# Patient Record
Sex: Male | Born: 1935 | Race: White | Hispanic: No | Marital: Married | Smoking: Current some day smoker
Health system: Southern US, Community
[De-identification: ages and names within clinical notes are randomized; demographics above are authoritative.]

## PROBLEM LIST (undated history)

## (undated) DIAGNOSIS — E785 Hyperlipidemia, unspecified: Secondary | ICD-10-CM

## (undated) DIAGNOSIS — E119 Type 2 diabetes mellitus without complications: Secondary | ICD-10-CM

## (undated) DIAGNOSIS — N289 Disorder of kidney and ureter, unspecified: Secondary | ICD-10-CM

## (undated) DIAGNOSIS — I10 Essential (primary) hypertension: Secondary | ICD-10-CM

## (undated) DIAGNOSIS — C259 Malignant neoplasm of pancreas, unspecified: Secondary | ICD-10-CM

## (undated) HISTORY — PX: OTHER SURGICAL HISTORY: SHX169

---

## 2014-03-05 DIAGNOSIS — E785 Hyperlipidemia, unspecified: Secondary | ICD-10-CM | POA: Insufficient documentation

## 2014-03-05 DIAGNOSIS — I10 Essential (primary) hypertension: Secondary | ICD-10-CM | POA: Insufficient documentation

## 2014-03-05 DIAGNOSIS — N184 Chronic kidney disease, stage 4 (severe): Secondary | ICD-10-CM | POA: Insufficient documentation

## 2014-03-24 ENCOUNTER — Ambulatory Visit: Payer: Self-pay | Admitting: Internal Medicine

## 2014-04-10 ENCOUNTER — Ambulatory Visit: Payer: Self-pay | Admitting: Internal Medicine

## 2014-05-11 ENCOUNTER — Ambulatory Visit: Payer: Self-pay | Admitting: Internal Medicine

## 2014-06-10 ENCOUNTER — Ambulatory Visit: Payer: Self-pay | Admitting: Internal Medicine

## 2015-04-10 DIAGNOSIS — E1121 Type 2 diabetes mellitus with diabetic nephropathy: Secondary | ICD-10-CM | POA: Insufficient documentation

## 2015-09-02 DIAGNOSIS — L578 Other skin changes due to chronic exposure to nonionizing radiation: Secondary | ICD-10-CM | POA: Diagnosis not present

## 2015-09-02 DIAGNOSIS — L859 Epidermal thickening, unspecified: Secondary | ICD-10-CM | POA: Diagnosis not present

## 2015-09-02 DIAGNOSIS — L570 Actinic keratosis: Secondary | ICD-10-CM | POA: Diagnosis not present

## 2015-10-07 DIAGNOSIS — E1121 Type 2 diabetes mellitus with diabetic nephropathy: Secondary | ICD-10-CM | POA: Diagnosis not present

## 2015-10-14 DIAGNOSIS — E1121 Type 2 diabetes mellitus with diabetic nephropathy: Secondary | ICD-10-CM | POA: Diagnosis not present

## 2015-10-14 DIAGNOSIS — N182 Chronic kidney disease, stage 2 (mild): Secondary | ICD-10-CM | POA: Diagnosis not present

## 2015-10-14 DIAGNOSIS — I10 Essential (primary) hypertension: Secondary | ICD-10-CM | POA: Diagnosis not present

## 2015-11-04 DIAGNOSIS — L578 Other skin changes due to chronic exposure to nonionizing radiation: Secondary | ICD-10-CM | POA: Diagnosis not present

## 2015-11-04 DIAGNOSIS — L570 Actinic keratosis: Secondary | ICD-10-CM | POA: Diagnosis not present

## 2015-12-08 DIAGNOSIS — L570 Actinic keratosis: Secondary | ICD-10-CM | POA: Diagnosis not present

## 2016-01-27 DIAGNOSIS — E118 Type 2 diabetes mellitus with unspecified complications: Secondary | ICD-10-CM | POA: Diagnosis not present

## 2016-02-02 DIAGNOSIS — G8929 Other chronic pain: Secondary | ICD-10-CM | POA: Diagnosis not present

## 2016-02-02 DIAGNOSIS — E1121 Type 2 diabetes mellitus with diabetic nephropathy: Secondary | ICD-10-CM | POA: Diagnosis not present

## 2016-02-02 DIAGNOSIS — M25561 Pain in right knee: Secondary | ICD-10-CM | POA: Diagnosis not present

## 2016-02-02 DIAGNOSIS — I10 Essential (primary) hypertension: Secondary | ICD-10-CM | POA: Diagnosis not present

## 2016-02-03 DIAGNOSIS — L578 Other skin changes due to chronic exposure to nonionizing radiation: Secondary | ICD-10-CM | POA: Diagnosis not present

## 2016-02-03 DIAGNOSIS — L570 Actinic keratosis: Secondary | ICD-10-CM | POA: Diagnosis not present

## 2016-02-03 DIAGNOSIS — Z85828 Personal history of other malignant neoplasm of skin: Secondary | ICD-10-CM | POA: Diagnosis not present

## 2016-02-03 DIAGNOSIS — L820 Inflamed seborrheic keratosis: Secondary | ICD-10-CM | POA: Diagnosis not present

## 2016-02-05 DIAGNOSIS — M6240 Contracture of muscle, unspecified site: Secondary | ICD-10-CM | POA: Diagnosis not present

## 2016-02-05 DIAGNOSIS — M791 Myalgia: Secondary | ICD-10-CM | POA: Diagnosis not present

## 2016-02-05 DIAGNOSIS — M9905 Segmental and somatic dysfunction of pelvic region: Secondary | ICD-10-CM | POA: Diagnosis not present

## 2016-02-05 DIAGNOSIS — M5430 Sciatica, unspecified side: Secondary | ICD-10-CM | POA: Diagnosis not present

## 2016-02-05 DIAGNOSIS — M9903 Segmental and somatic dysfunction of lumbar region: Secondary | ICD-10-CM | POA: Diagnosis not present

## 2016-02-05 DIAGNOSIS — M9902 Segmental and somatic dysfunction of thoracic region: Secondary | ICD-10-CM | POA: Diagnosis not present

## 2016-02-08 DIAGNOSIS — M9903 Segmental and somatic dysfunction of lumbar region: Secondary | ICD-10-CM | POA: Diagnosis not present

## 2016-02-08 DIAGNOSIS — M791 Myalgia: Secondary | ICD-10-CM | POA: Diagnosis not present

## 2016-02-08 DIAGNOSIS — M9905 Segmental and somatic dysfunction of pelvic region: Secondary | ICD-10-CM | POA: Diagnosis not present

## 2016-02-08 DIAGNOSIS — M6240 Contracture of muscle, unspecified site: Secondary | ICD-10-CM | POA: Diagnosis not present

## 2016-02-08 DIAGNOSIS — M5430 Sciatica, unspecified side: Secondary | ICD-10-CM | POA: Diagnosis not present

## 2016-02-08 DIAGNOSIS — M9902 Segmental and somatic dysfunction of thoracic region: Secondary | ICD-10-CM | POA: Diagnosis not present

## 2016-02-10 DIAGNOSIS — M5430 Sciatica, unspecified side: Secondary | ICD-10-CM | POA: Diagnosis not present

## 2016-02-10 DIAGNOSIS — M9903 Segmental and somatic dysfunction of lumbar region: Secondary | ICD-10-CM | POA: Diagnosis not present

## 2016-02-10 DIAGNOSIS — M9902 Segmental and somatic dysfunction of thoracic region: Secondary | ICD-10-CM | POA: Diagnosis not present

## 2016-02-10 DIAGNOSIS — M791 Myalgia: Secondary | ICD-10-CM | POA: Diagnosis not present

## 2016-02-10 DIAGNOSIS — M9905 Segmental and somatic dysfunction of pelvic region: Secondary | ICD-10-CM | POA: Diagnosis not present

## 2016-02-10 DIAGNOSIS — M6240 Contracture of muscle, unspecified site: Secondary | ICD-10-CM | POA: Diagnosis not present

## 2016-02-12 DIAGNOSIS — M9902 Segmental and somatic dysfunction of thoracic region: Secondary | ICD-10-CM | POA: Diagnosis not present

## 2016-02-12 DIAGNOSIS — M9903 Segmental and somatic dysfunction of lumbar region: Secondary | ICD-10-CM | POA: Diagnosis not present

## 2016-02-12 DIAGNOSIS — M9905 Segmental and somatic dysfunction of pelvic region: Secondary | ICD-10-CM | POA: Diagnosis not present

## 2016-02-12 DIAGNOSIS — M6240 Contracture of muscle, unspecified site: Secondary | ICD-10-CM | POA: Diagnosis not present

## 2016-02-12 DIAGNOSIS — M5430 Sciatica, unspecified side: Secondary | ICD-10-CM | POA: Diagnosis not present

## 2016-02-12 DIAGNOSIS — M791 Myalgia: Secondary | ICD-10-CM | POA: Diagnosis not present

## 2016-02-19 DIAGNOSIS — M5430 Sciatica, unspecified side: Secondary | ICD-10-CM | POA: Diagnosis not present

## 2016-02-19 DIAGNOSIS — M791 Myalgia: Secondary | ICD-10-CM | POA: Diagnosis not present

## 2016-02-19 DIAGNOSIS — M9905 Segmental and somatic dysfunction of pelvic region: Secondary | ICD-10-CM | POA: Diagnosis not present

## 2016-02-19 DIAGNOSIS — M6240 Contracture of muscle, unspecified site: Secondary | ICD-10-CM | POA: Diagnosis not present

## 2016-02-19 DIAGNOSIS — M9902 Segmental and somatic dysfunction of thoracic region: Secondary | ICD-10-CM | POA: Diagnosis not present

## 2016-02-19 DIAGNOSIS — M9903 Segmental and somatic dysfunction of lumbar region: Secondary | ICD-10-CM | POA: Diagnosis not present

## 2016-02-26 DIAGNOSIS — M5430 Sciatica, unspecified side: Secondary | ICD-10-CM | POA: Diagnosis not present

## 2016-02-26 DIAGNOSIS — M9905 Segmental and somatic dysfunction of pelvic region: Secondary | ICD-10-CM | POA: Diagnosis not present

## 2016-02-26 DIAGNOSIS — M6240 Contracture of muscle, unspecified site: Secondary | ICD-10-CM | POA: Diagnosis not present

## 2016-02-26 DIAGNOSIS — M9903 Segmental and somatic dysfunction of lumbar region: Secondary | ICD-10-CM | POA: Diagnosis not present

## 2016-02-26 DIAGNOSIS — M9902 Segmental and somatic dysfunction of thoracic region: Secondary | ICD-10-CM | POA: Diagnosis not present

## 2016-02-26 DIAGNOSIS — M791 Myalgia: Secondary | ICD-10-CM | POA: Diagnosis not present

## 2016-03-09 DIAGNOSIS — M9903 Segmental and somatic dysfunction of lumbar region: Secondary | ICD-10-CM | POA: Diagnosis not present

## 2016-03-09 DIAGNOSIS — M9905 Segmental and somatic dysfunction of pelvic region: Secondary | ICD-10-CM | POA: Diagnosis not present

## 2016-03-09 DIAGNOSIS — M6240 Contracture of muscle, unspecified site: Secondary | ICD-10-CM | POA: Diagnosis not present

## 2016-03-09 DIAGNOSIS — M5430 Sciatica, unspecified side: Secondary | ICD-10-CM | POA: Diagnosis not present

## 2016-03-09 DIAGNOSIS — M9902 Segmental and somatic dysfunction of thoracic region: Secondary | ICD-10-CM | POA: Diagnosis not present

## 2016-03-09 DIAGNOSIS — M791 Myalgia: Secondary | ICD-10-CM | POA: Diagnosis not present

## 2016-03-17 DIAGNOSIS — L570 Actinic keratosis: Secondary | ICD-10-CM | POA: Diagnosis not present

## 2016-04-14 DIAGNOSIS — E1121 Type 2 diabetes mellitus with diabetic nephropathy: Secondary | ICD-10-CM | POA: Diagnosis not present

## 2016-04-21 DIAGNOSIS — E1121 Type 2 diabetes mellitus with diabetic nephropathy: Secondary | ICD-10-CM | POA: Diagnosis not present

## 2016-04-21 DIAGNOSIS — Z0001 Encounter for general adult medical examination with abnormal findings: Secondary | ICD-10-CM | POA: Diagnosis not present

## 2016-04-21 DIAGNOSIS — D649 Anemia, unspecified: Secondary | ICD-10-CM | POA: Diagnosis not present

## 2016-04-21 DIAGNOSIS — I10 Essential (primary) hypertension: Secondary | ICD-10-CM | POA: Diagnosis not present

## 2016-04-21 DIAGNOSIS — Z0000 Encounter for general adult medical examination without abnormal findings: Secondary | ICD-10-CM | POA: Diagnosis not present

## 2016-04-21 DIAGNOSIS — E7800 Pure hypercholesterolemia, unspecified: Secondary | ICD-10-CM | POA: Diagnosis not present

## 2016-04-21 DIAGNOSIS — Z23 Encounter for immunization: Secondary | ICD-10-CM | POA: Diagnosis not present

## 2016-04-25 DIAGNOSIS — D649 Anemia, unspecified: Secondary | ICD-10-CM | POA: Diagnosis not present

## 2016-05-09 ENCOUNTER — Ambulatory Visit: Payer: PPO | Admitting: Podiatry

## 2016-05-16 ENCOUNTER — Encounter: Payer: Self-pay | Admitting: Podiatry

## 2016-05-16 ENCOUNTER — Ambulatory Visit (INDEPENDENT_AMBULATORY_CARE_PROVIDER_SITE_OTHER): Payer: PPO

## 2016-05-16 ENCOUNTER — Ambulatory Visit (INDEPENDENT_AMBULATORY_CARE_PROVIDER_SITE_OTHER): Payer: PPO | Admitting: Podiatry

## 2016-05-16 ENCOUNTER — Other Ambulatory Visit: Payer: Self-pay | Admitting: *Deleted

## 2016-05-16 ENCOUNTER — Encounter: Payer: Self-pay | Admitting: *Deleted

## 2016-05-16 VITALS — BP 128/72 | HR 70 | Resp 16

## 2016-05-16 DIAGNOSIS — M79671 Pain in right foot: Secondary | ICD-10-CM

## 2016-05-16 DIAGNOSIS — M79672 Pain in left foot: Secondary | ICD-10-CM

## 2016-05-16 DIAGNOSIS — M79676 Pain in unspecified toe(s): Secondary | ICD-10-CM | POA: Diagnosis not present

## 2016-05-16 DIAGNOSIS — B351 Tinea unguium: Secondary | ICD-10-CM | POA: Diagnosis not present

## 2016-05-16 DIAGNOSIS — M722 Plantar fascial fibromatosis: Secondary | ICD-10-CM

## 2016-05-16 NOTE — Progress Notes (Signed)
   Subjective:    Patient ID: Ethan Henry, male    DOB: [birth year], 82 y.o.   MRN: [id number]  HPI: He presents today with a chief complaint of painful back and knee as well as his heel and was recommended to follow up with [place] or orthotics by physical therapy. He also complains of painfully elongated toenails bilateral.    Review of Systems  Musculoskeletal: Positive for arthralgias and back pain.  All other systems reviewed and are negative.      Objective:   Physical Exam: Vital signs are stable he is alert and oriented 3. Pulses are strongly palpable. Neurologic sensorium is intact. Deep tendon reflexes are intact. Muscle strength is normal bilateral. Orthopedic evaluation demonstrates tenderness around the ankle and knee in the right heel. Radiographs do not demonstrate any Major osseous abnormalities. No fractures so her last x-ray changes midfoot. Soft tissue swelling around the plantar medial calcaneal tubercle. Cutaneous evaluation shows supple well-hydrated cutis tenderness along thick yellow dystrophic with mycotic.        Assessment & Plan:  Painless onychomycosis plan fasciitis ankle pain and knee pain.  Plan: He was scanned for set of orthotics today and I debrided his toenails 1 through 5 bilaterally.

## 2016-05-27 DIAGNOSIS — E119 Type 2 diabetes mellitus without complications: Secondary | ICD-10-CM | POA: Diagnosis not present

## 2016-06-08 DIAGNOSIS — L570 Actinic keratosis: Secondary | ICD-10-CM | POA: Diagnosis not present

## 2016-06-08 DIAGNOSIS — L578 Other skin changes due to chronic exposure to nonionizing radiation: Secondary | ICD-10-CM | POA: Diagnosis not present

## 2016-06-13 ENCOUNTER — Ambulatory Visit: Payer: PPO | Admitting: Podiatry

## 2016-06-13 ENCOUNTER — Encounter: Payer: Self-pay | Admitting: *Deleted

## 2016-07-13 ENCOUNTER — Ambulatory Visit: Payer: PPO | Admitting: Podiatry

## 2016-07-15 DIAGNOSIS — E1121 Type 2 diabetes mellitus with diabetic nephropathy: Secondary | ICD-10-CM | POA: Diagnosis not present

## 2016-07-15 DIAGNOSIS — D649 Anemia, unspecified: Secondary | ICD-10-CM | POA: Diagnosis not present

## 2016-07-22 DIAGNOSIS — I10 Essential (primary) hypertension: Secondary | ICD-10-CM | POA: Diagnosis not present

## 2016-07-22 DIAGNOSIS — E1121 Type 2 diabetes mellitus with diabetic nephropathy: Secondary | ICD-10-CM | POA: Diagnosis not present

## 2016-07-22 DIAGNOSIS — D649 Anemia, unspecified: Secondary | ICD-10-CM | POA: Diagnosis not present

## 2016-08-08 DIAGNOSIS — D649 Anemia, unspecified: Secondary | ICD-10-CM | POA: Diagnosis not present

## 2016-08-10 DIAGNOSIS — R195 Other fecal abnormalities: Secondary | ICD-10-CM | POA: Diagnosis not present

## 2016-08-10 DIAGNOSIS — R197 Diarrhea, unspecified: Secondary | ICD-10-CM | POA: Diagnosis not present

## 2016-08-10 DIAGNOSIS — R634 Abnormal weight loss: Secondary | ICD-10-CM | POA: Diagnosis not present

## 2016-08-10 DIAGNOSIS — R748 Abnormal levels of other serum enzymes: Secondary | ICD-10-CM | POA: Diagnosis not present

## 2016-08-10 DIAGNOSIS — D649 Anemia, unspecified: Secondary | ICD-10-CM | POA: Diagnosis not present

## 2016-08-11 ENCOUNTER — Ambulatory Visit
Admission: RE | Admit: 2016-08-11 | Discharge: 2016-08-11 | Disposition: A | Discharge status: Home / Self Care | Payer: PPO | Source: Ambulatory Visit | Attending: Student | Admitting: Student

## 2016-08-11 ENCOUNTER — Other Ambulatory Visit: Payer: Self-pay | Admitting: Student

## 2016-08-11 DIAGNOSIS — R17 Unspecified jaundice: Secondary | ICD-10-CM | POA: Diagnosis not present

## 2016-08-11 DIAGNOSIS — R748 Abnormal levels of other serum enzymes: Secondary | ICD-10-CM

## 2016-08-11 DIAGNOSIS — E279 Disorder of adrenal gland, unspecified: Secondary | ICD-10-CM | POA: Insufficient documentation

## 2016-08-11 DIAGNOSIS — C787 Secondary malignant neoplasm of liver and intrahepatic bile duct: Secondary | ICD-10-CM | POA: Insufficient documentation

## 2016-08-11 DIAGNOSIS — K869 Disease of pancreas, unspecified: Secondary | ICD-10-CM | POA: Insufficient documentation

## 2016-08-11 DIAGNOSIS — R634 Abnormal weight loss: Secondary | ICD-10-CM

## 2016-08-11 DIAGNOSIS — I81 Portal vein thrombosis: Secondary | ICD-10-CM | POA: Insufficient documentation

## 2016-08-11 DIAGNOSIS — R197 Diarrhea, unspecified: Secondary | ICD-10-CM | POA: Diagnosis not present

## 2016-08-11 HISTORY — DX: Essential (primary) hypertension: I10

## 2016-08-11 HISTORY — DX: Disorder of kidney and ureter, unspecified: N28.9

## 2016-08-11 MED ORDER — IOPAMIDOL (ISOVUE-300) INJECTION 61%
75.0000 mL | Freq: Once | INTRAVENOUS | Status: AC | PRN
  Administered 2016-08-11: 75 mL via INTRAVENOUS

## 2016-08-14 DIAGNOSIS — C787 Secondary malignant neoplasm of liver and intrahepatic bile duct: Secondary | ICD-10-CM | POA: Insufficient documentation

## 2016-08-14 DIAGNOSIS — I81 Portal vein thrombosis: Secondary | ICD-10-CM | POA: Insufficient documentation

## 2016-08-14 NOTE — Progress Notes (Signed)
[facility] day:  08/15/2016  Chief Complaint: Ethan Henry is a 83 y.o. male with a pancreatic mass and liver metastasis who is referred by Dr. Vira Agar in consultation for assessment and management.  HPI:   The patient was seen by gastroenterology on 08/10/2016 for diarrhea and anemia. Diarrhea appeared to correlate with an increased dose of Actos.  Actos was discontinued on 07/22/2016 with slight improvement in stool.  He also noted a 9 pound weight loss since 04/21/2016.  Hemogloblin had decreased from 13.2 on 04/14/2016 to 11.8 on 07/15/2016.  Ferritin and iron studies were normal on 04/21/2016 and 07/22/2016.  Creatinine had increased from 1.4 to 1.7.  One of 2 guaiac cards were positive on 08/08/2016.  Labs on 08/10/2016 revealed a hematocrit of 35.5, hemoglobin 11.7, platelets 215,000, white count 13,800 with an [place] of 10,690. Comprehensive metabolic panel included an AST 55, ALT 52, bilirubin 1.5, and alkaline phosphatase 539.  Fractionated alkaline phosphatase revealed a value of 602 (liver 61%, bone 39%).  GGT was 362.  Sodium was 139, potassium 5.8, creatinine 1.7, glucose 358, albumen 3.9.  Because of elevated liver function tests, imaging was ordered.  Abdomen and pelvic CT scan on 08/11/2016 revealed a 6.5 cm mass in the pancreatic body and tail consistent with primary pancreatic carcinoma.  The mass involved the splenic artery and vein as well as the superior mesenteric vein.  There was diffuse liver metastasis.  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 There was tiny subcentimeter peripancreatic lymph nodes. There was an indeterminate 2 cm left adrenal mass.  Symptomatically, he a 26 pound weight loss since 04/2016.  He feels generally weak.  He denies any nausea, vomiting or diarrhea.  He typically has 1 bowel movement a day, but periodically has 3-4 a day.  He notes intermittent right shoulder pain.   Past Medical History:   Diagnosis Date  . Hypertension   . Renal insufficiency    stage 2 renal dz.     Past Surgical History:  Procedure Laterality Date  . Partial shoulder arthroscopy      History reviewed. No pertinent family history.  Social History:  reports that he has been smoking.  He has been smoking about 1.50 packs per day. He has never used smokeless tobacco. His alcohol and drug histories are not on file.  He drinks beer on the weekend.  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  He lives in [place].  The patient is accompanied by his wife, Ethan Henry, and his daughter, Ethan Henry, today.  Allergies:  Allergies  Allergen Reactions  . Glipizide Rash    Current Medications: Current Outpatient Prescriptions  Medication Sig Dispense Refill  . amLODipine (NORVASC) 5 MG tablet     . aspirin EC 81 MG tablet Take by mouth.    . Blood Glucose Calibration (OT ULTRA/FASTTK CNTRL SOLN) SOLN     . Cholecalciferol (VITAMIN D-1000 MAX ST) 1000 units tablet Take by mouth.    . Cyanocobalamin (RA VITAMIN B-12 TR) 1000 MCG TBCR Take by mouth.    Marland Kitchen glucose blood (ONE TOUCH ULTRA TEST) test strip     . Lancets (ONETOUCH ULTRASOFT) lancets     . losartan-hydrochlorothiazide (HYZAAR) 100-25 MG tablet TAKE ONE (1) TABLET BY MOUTH EVERY DAY    . meloxicam (MOBIC) 7.5 MG tablet TAKE ONE (1) TABLET BY MOUTH EVERY DAY    . Omega-3 Fatty Acids (FISH OIL PO) Take by  mouth.    . pravastatin (PRAVACHOL) 40 MG tablet TAKE ONE TABLET EACH EVENING AS DIRECTED    . pioglitazone (ACTOS) 30 MG tablet     . vitamin C (ASCORBIC ACID) 500 MG tablet Take by mouth.     No current facility-administered medications for this visit.     Review of Systems:  GENERAL:  Feels weak.  No fevers or sweats .  Weight loss of 26 pounds (151 pounds to 125 pounds) since 04/2016. PERFORMANCE STATUS (ECOG):  1 HEENT:  No visual changes, runny nose, sore throat, mouth sores or tenderness. Lungs: No shortness of breath or cough.  No  hemoptysis. Cardiac:  No chest pain, palpitations, orthopnea, or PND. GI:  No nausea, vomiting, constipation, melena or hematochezia.  1 bowel movement a day, but periodically has 3-4/day.  No prior colonoscopy. GU:  No urgency, frequency, dysuria, or hematuria. Musculoskeletal:  No back pain.  Intermittent right shoulder pain.  No muscle tenderness. Extremities:  No pain or swelling. Skin:  No rashes or skin changes. Neuro:  No headache, numbness or weakness, balance or coordination issues. Endocrine:  No diabetes, thyroid issues, hot flashes or night sweats. Psych:  No mood changes, depression or anxiety. Pain:  No focal pain. Review of systems:  All other systems reviewed and found to be negative.  Physical Exam: Blood pressure 121/81, pulse (!) 130, temperature (!) 94.9 F (34.9 C), temperature source Tympanic, resp. rate 18, height 5\' 7"  (1.702 m), weight 125 lb 3.5 oz (56.8 kg). GENERAL:  Well developed, well nourished, gentleman sitting comfortably in the exam room in no acute distress. MENTAL STATUS:  Alert and oriented to person, place and time. HEAD:  Pearline Cables hair.  Normocephalic, atraumatic, face symmetric, no Cushingoid features. EYES:  Glasses.  Dark eyes.  Pupils equal round and reactive to light and accomodation.  No conjunctivitis or scleral icterus. ENT:  Oropharynx clear without lesion.  Upper and lower dentures.  Tongue normal.  Mucous membranes moist.  RESPIRATORY:  Clear to auscultation without rales, wheezes or rhonchi. CARDIOVASCULAR:  Regular rate and rhythm without murmur, rub or gallop. ABDOMEN:  Scaphoid.  Soft, non-tender, with active bowel sounds, and no hepatosplenomegaly.  No masses. BACK:  Sacral pain on palpation.   SKIN:  No rashes, ulcers or lesions. EXTREMITIES: No edema, no skin discoloration or tenderness.  No palpable cords. LYMPH NODES: No palpable cervical, supraclavicular, axillary or inguinal adenopathy  NEUROLOGICAL: Unremarkable. PSYCH:   Appropriate.  No visits with results within 3 Day(s) from this visit.  Latest known visit with results is:  No results found for any previous visit.    Assessment:  Ethan Henry is a 83 y.o. male with a pancreatic body and tail mass with liver metastasis.  He presented with diarrhea and weight loss.  He has lost 26 pounds since 04/2016.  Abdomen and pelvic CT scan on 08/11/2016 revealed a 6.5 cm mass in the pancreatic body and tail consistent with primary pancreatic carcinoma.  The mass involved the splenic artery and vein as well as the superior mesenteric vein.  There was diffuse liver metastasis.  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 There was tiny subcentimeter peripancreatic lymph nodes. There was an indeterminate 2 cm left adrenal mass.  He has a normocytic anemia. Ferritin and iron studies were normal on 07/22/2016.  Creatinine was 1.7.  One of 2 guaiac cards were positive on 08/08/2016.  He has never had a colonoscopy.  Symptomatically, he a 26 pound weight  loss since 04/2016.  Exam reveals no palpable masses.  Plan: 1.  Review images with patient and family.  Discuss probable stage IV pancreatic cancer secondary to liver lesions.  Discuss obtaining a chest CT or PET scan for complete staging.  Discuss obtaining a  liver biopsy for diagnosis.  Discuss differential diagnosis of pancreatic adenocarcinoma versus neuroendocrine tumor.  Suspect adenocarcinoma of the pancreas.  Discuss treatment is palliative.  Discuss treatment of pancreatic adenocarcinoma.  Discuss gemcitabine versus gemcitabine + Abraxane.  Side effects reviewed in detail.  Discus port-a-cath placement. 2.  Discuss superior mesenteric and portal vein thrombosis.  Discuss plan for anticoagulation after liver biopsy. 3.  Discuss images with radiology- done.  Anticipate ultrasound guided liver biopsy. 4.  Labs today:  CBC with diff, CA19-9, PT, PTT. 5.  PET scan. 6.  Ultrasound guided liver biopsy. 7.   RTC for MD assessment after PET scan and liver biopsy.   Lequita Asal, MD  08/15/2016, 8:34 AM

## 2016-08-15 ENCOUNTER — Inpatient Hospital Stay: Payer: PPO | Attending: Hematology and Oncology | Admitting: Hematology and Oncology

## 2016-08-15 ENCOUNTER — Telehealth: Payer: Self-pay | Admitting: Interventional Radiology

## 2016-08-15 ENCOUNTER — Encounter (INDEPENDENT_AMBULATORY_CARE_PROVIDER_SITE_OTHER): Payer: Self-pay

## 2016-08-15 ENCOUNTER — Encounter: Payer: Self-pay | Admitting: Hematology and Oncology

## 2016-08-15 ENCOUNTER — Inpatient Hospital Stay: Payer: PPO

## 2016-08-15 VITALS — BP 121/81 | HR 96 | Temp 94.9°F | Resp 18 | Ht 67.0 in | Wt 125.2 lb

## 2016-08-15 DIAGNOSIS — I81 Portal vein thrombosis: Secondary | ICD-10-CM | POA: Insufficient documentation

## 2016-08-15 DIAGNOSIS — F17210 Nicotine dependence, cigarettes, uncomplicated: Secondary | ICD-10-CM | POA: Insufficient documentation

## 2016-08-15 DIAGNOSIS — E785 Hyperlipidemia, unspecified: Secondary | ICD-10-CM | POA: Diagnosis not present

## 2016-08-15 DIAGNOSIS — C259 Malignant neoplasm of pancreas, unspecified: Secondary | ICD-10-CM | POA: Insufficient documentation

## 2016-08-15 DIAGNOSIS — Z79899 Other long term (current) drug therapy: Secondary | ICD-10-CM | POA: Diagnosis not present

## 2016-08-15 DIAGNOSIS — D649 Anemia, unspecified: Secondary | ICD-10-CM | POA: Insufficient documentation

## 2016-08-15 DIAGNOSIS — Z66 Do not resuscitate: Secondary | ICD-10-CM | POA: Insufficient documentation

## 2016-08-15 DIAGNOSIS — K869 Disease of pancreas, unspecified: Secondary | ICD-10-CM

## 2016-08-15 DIAGNOSIS — N289 Disorder of kidney and ureter, unspecified: Secondary | ICD-10-CM | POA: Insufficient documentation

## 2016-08-15 DIAGNOSIS — C787 Secondary malignant neoplasm of liver and intrahepatic bile duct: Secondary | ICD-10-CM

## 2016-08-15 DIAGNOSIS — E871 Hypo-osmolality and hyponatremia: Secondary | ICD-10-CM | POA: Diagnosis not present

## 2016-08-15 DIAGNOSIS — E279 Disorder of adrenal gland, unspecified: Secondary | ICD-10-CM | POA: Insufficient documentation

## 2016-08-15 DIAGNOSIS — E119 Type 2 diabetes mellitus without complications: Secondary | ICD-10-CM | POA: Diagnosis not present

## 2016-08-15 DIAGNOSIS — K8689 Other specified diseases of pancreas: Secondary | ICD-10-CM

## 2016-08-15 DIAGNOSIS — Z7982 Long term (current) use of aspirin: Secondary | ICD-10-CM | POA: Diagnosis not present

## 2016-08-15 DIAGNOSIS — I10 Essential (primary) hypertension: Secondary | ICD-10-CM | POA: Diagnosis not present

## 2016-08-15 LAB — CBC WITH DIFFERENTIAL/PLATELET
Basophils Absolute: 0 10*3/uL (ref 0–0.1)
Basophils Relative: 0 %
Eosinophils Absolute: 0 10*3/uL (ref 0–0.7)
Eosinophils Relative: 0 %
HCT: 34 % — ABNORMAL LOW (ref 40.0–52.0)
Hemoglobin: 11.4 g/dL — ABNORMAL LOW (ref 13.0–18.0)
Lymphocytes Relative: 6 %
Lymphs Abs: 0.8 10*3/uL — ABNORMAL LOW (ref 1.0–3.6)
MCH: 31.1 pg (ref 26.0–34.0)
MCHC: 33.4 g/dL (ref 32.0–36.0)
MCV: 93 fL (ref 80.0–100.0)
Monocytes Absolute: 1.4 10*3/uL — ABNORMAL HIGH (ref 0.2–1.0)
Monocytes Relative: 12 %
Neutro Abs: 9.9 10*3/uL — ABNORMAL HIGH (ref 1.4–6.5)
Neutrophils Relative %: 82 %
Platelets: 215 10*3/uL (ref 150–440)
RBC: 3.66 MIL/uL — ABNORMAL LOW (ref 4.40–5.90)
RDW: 14.3 % (ref 11.5–14.5)
WBC: 12.2 10*3/uL — ABNORMAL HIGH (ref 3.8–10.6)

## 2016-08-15 LAB — APTT: aPTT: 32 seconds (ref 24–36)

## 2016-08-15 LAB — PROTIME-INR
INR: 1.38
Prothrombin Time: 17.1 seconds — ABNORMAL HIGH (ref 11.4–15.2)

## 2016-08-15 NOTE — Telephone Encounter (Signed)
Reviewed CT over phone with Dr Mike Gip. Liver lesions approachable for US guided core biopsy. We can set this up as outpt under moderate sedation as needed.

## 2016-08-15 NOTE — Progress Notes (Signed)
Patient here today as new evaluation regarding pancreatic mass.  Referred by Dr. Vira Agar.

## 2016-08-16 LAB — CANCER ANTIGEN 19-9: CA 19-9: 124323 U/mL — ABNORMAL HIGH (ref 0–35)

## 2016-08-17 ENCOUNTER — Other Ambulatory Visit: Payer: Self-pay | Admitting: Radiology

## 2016-08-17 ENCOUNTER — Telehealth: Payer: Self-pay | Admitting: *Deleted

## 2016-08-17 NOTE — Telephone Encounter (Signed)
Called patient to inform him of his bone marrow for 08-18-16 at 0930 and to be NPO after MN, voiced understanding, appts for f/u given to patient, voiced understanding.

## 2016-08-18 ENCOUNTER — Telehealth: Payer: Self-pay | Admitting: *Deleted

## 2016-08-18 ENCOUNTER — Ambulatory Visit
Admission: RE | Admit: 2016-08-18 | Discharge: 2016-08-18 | Disposition: A | Discharge status: Home / Self Care | Payer: PPO | Source: Ambulatory Visit | Attending: Hematology and Oncology | Admitting: Hematology and Oncology

## 2016-08-18 DIAGNOSIS — Z888 Allergy status to other drugs, medicaments and biological substances status: Secondary | ICD-10-CM | POA: Diagnosis not present

## 2016-08-18 DIAGNOSIS — C259 Malignant neoplasm of pancreas, unspecified: Secondary | ICD-10-CM | POA: Diagnosis not present

## 2016-08-18 DIAGNOSIS — F17210 Nicotine dependence, cigarettes, uncomplicated: Secondary | ICD-10-CM | POA: Diagnosis not present

## 2016-08-18 DIAGNOSIS — Z7982 Long term (current) use of aspirin: Secondary | ICD-10-CM | POA: Insufficient documentation

## 2016-08-18 DIAGNOSIS — I10 Essential (primary) hypertension: Secondary | ICD-10-CM | POA: Insufficient documentation

## 2016-08-18 DIAGNOSIS — K831 Obstruction of bile duct: Secondary | ICD-10-CM | POA: Insufficient documentation

## 2016-08-18 DIAGNOSIS — E119 Type 2 diabetes mellitus without complications: Secondary | ICD-10-CM | POA: Diagnosis not present

## 2016-08-18 DIAGNOSIS — C787 Secondary malignant neoplasm of liver and intrahepatic bile duct: Secondary | ICD-10-CM | POA: Diagnosis not present

## 2016-08-18 DIAGNOSIS — K8689 Other specified diseases of pancreas: Secondary | ICD-10-CM

## 2016-08-18 DIAGNOSIS — K869 Disease of pancreas, unspecified: Secondary | ICD-10-CM | POA: Diagnosis not present

## 2016-08-18 DIAGNOSIS — K7689 Other specified diseases of liver: Secondary | ICD-10-CM | POA: Diagnosis not present

## 2016-08-18 HISTORY — DX: Type 2 diabetes mellitus without complications: E11.9

## 2016-08-18 LAB — GLUCOSE, CAPILLARY: Glucose-Capillary: 241 mg/dL — ABNORMAL HIGH (ref 65–99)

## 2016-08-18 MED ORDER — FENTANYL CITRATE (PF) 100 MCG/2ML IJ SOLN
INTRAMUSCULAR | Status: AC | PRN
  Administered 2016-08-18 (×2): 25 ug via INTRAVENOUS

## 2016-08-18 MED ORDER — SODIUM CHLORIDE 0.9 % IV SOLN
INTRAVENOUS | Status: DC
  Administered 2016-08-18: 10:00:00 via INTRAVENOUS

## 2016-08-18 MED ORDER — HYDROCODONE-ACETAMINOPHEN 5-325 MG PO TABS
1.0000 | ORAL_TABLET | ORAL | Status: DC | PRN
  Administered 2016-08-18: 1 via ORAL
  Filled 2016-08-18 (×2): qty 2

## 2016-08-18 MED ORDER — MIDAZOLAM HCL 2 MG/2ML IJ SOLN
INTRAMUSCULAR | Status: AC | PRN
  Administered 2016-08-18 (×2): 0.5 mg via INTRAVENOUS

## 2016-08-18 NOTE — H&P (Signed)
Chief Complaint: Patient was seen in consultation today for liver biopsy at the request of [place] C  Referring Physician(s): [place] C  Patient Status: [facility] - Out-pt  History of Present Illness: Ethan Henry is a 83 y.o. male presenting with a large pancreatic mass, multiple liver lesions, back pain and weight loss.  Past Medical History:  Diagnosis Date  . Diabetes mellitus without complication ([place])   . Hypertension   . Renal insufficiency    stage 2 renal dz.     Past Surgical History:  Procedure Laterality Date  . Partial shoulder arthroscopy      Allergies: Glipizide  Medications: Prior to Admission medications   Medication Sig Start Date End Date Taking? Authorizing Provider  amLODipine (NORVASC) 5 MG tablet  05/02/16  Yes Historical Provider, MD  aspirin EC 81 MG tablet Take by mouth.   Yes Historical Provider, MD  Cholecalciferol (VITAMIN D-1000 MAX ST) 1000 units tablet Take by mouth.   Yes Historical Provider, MD  Cyanocobalamin (RA VITAMIN B-12 TR) 1000 MCG TBCR Take by mouth.   Yes Historical Provider, MD  losartan-hydrochlorothiazide (HYZAAR) 100-25 MG tablet TAKE ONE (1) TABLET BY MOUTH EVERY DAY 08/03/15  Yes Historical Provider, MD  meloxicam (MOBIC) 7.5 MG tablet TAKE ONE (1) TABLET BY MOUTH EVERY DAY 09/03/15  Yes Historical Provider, MD  pioglitazone (ACTOS) 30 MG tablet  03/19/16  Yes Historical Provider, MD  pravastatin (PRAVACHOL) 40 MG tablet TAKE ONE TABLET EACH EVENING AS DIRECTED 12/02/15  Yes Historical Provider, MD  vitamin C (ASCORBIC ACID) 500 MG tablet Take by mouth.   Yes Historical Provider, MD  Blood Glucose Calibration (OT ULTRA/FASTTK CNTRL SOLN) SOLN  01/26/16   Historical Provider, MD  glucose blood (ONE TOUCH ULTRA TEST) test strip  01/26/16   Historical Provider, MD  Lancets (ONETOUCH ULTRASOFT) lancets  01/26/16   Historical Provider, MD  Omega-3 Fatty Acids (FISH OIL PO) Take by mouth.    Historical Provider, MD     No family history on file.  Social History   Social History  . Marital status: Married    Spouse name: N/A  . Number of children: N/A  . Years of education: N/A   Social History Main Topics  . Smoking status: Current Some Day Smoker    Packs/day: 1.50  . Smokeless tobacco: Never Used     Comment: Smokes only on weekends  . Alcohol use Not on file  . Drug use: Unknown  . Sexual activity: Not on file   Other Topics Concern  . Not on file   Social History Narrative  . No narrative on file    ECOG Status: 1 - Symptomatic but completely ambulatory  Review of Systems: A 12 point ROS discussed and pertinent positives are indicated in the HPI above.  All other systems are negative.  Review of Systems  Constitutional: Positive for activity change and appetite change.  HENT: Negative.   Respiratory: Negative.   Cardiovascular: Negative.   Gastrointestinal: Negative.   Genitourinary: Negative.   Musculoskeletal: Positive for back pain.  Neurological: Negative.   Hematological: Negative.     Vital Signs: BP 118/64   Pulse 91   Resp 14   SpO2 98%   Physical Exam  Constitutional: He is oriented to person, place, and time. No distress.  cachectic  HENT:  Head: Normocephalic and atraumatic.  Neck: Neck supple. No JVD present. No tracheal deviation present.  Cardiovascular: Normal rate, regular rhythm and normal heart sounds.  Exam reveals no gallop and no friction rub.   No murmur heard. Pulmonary/Chest: Effort normal and breath sounds normal. No respiratory distress. He has no wheezes. He has no rales.  Abdominal: Soft. Bowel sounds are normal. He exhibits no distension and no mass. There is no tenderness. There is no guarding.  Musculoskeletal: He exhibits no edema.  Lymphadenopathy:    He has no cervical adenopathy.  Neurological: He is alert and oriented to person, place, and time.  Skin: He is not diaphoretic.  Vitals reviewed.   Mallampati Score:  MD  Evaluation Airway: WNL Heart: WNL Abdomen: WNL Chest/ Lungs: WNL ASA  Classification: 2 Mallampati/Airway Score: One  Imaging: Ct Abdomen Pelvis W Contrast  Result Date: 08/11/2016 CLINICAL DATA:  20 pound weight loss in past year. Elevated liver function tests. Diarrhea. EXAM: CT ABDOMEN AND PELVIS WITH CONTRAST TECHNIQUE: Multidetector CT imaging of the abdomen and pelvis was performed using the standard protocol following bolus administration of intravenous contrast. CONTRAST:  66mL ISOVUE-300 IOPAMIDOL (ISOVUE-300) INJECTION 61% COMPARISON:  None. FINDINGS: Lower Chest: No acute findings. Hepatobiliary: Diffuse low-attenuation liver masses, consistent with diffuse liver metastases. Index lesion in dome of right hepatic lobe measures 3.3 x 2.8 cm on image 12/2. Gallbladder is unremarkable. No evidence of biliary ductal dilatation. Pancreas: Solid-appearing mass in the pancreatic body and tail measures 6.5 x 3.0 cm. This is consistent with primary pancreatic carcinoma. This mass involves the splenic artery and vein, and superior mesenteric vein. 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 Spleen: Within normal limits in size and appearance. Adrenals/Urinary Tract: 2 cm left adrenal mass is indeterminate. Normal appearance of right adrenal gland. Multiple bilateral renal cysts are seen, however there is no evidence of renal mass or hydronephrosis. Tiny nonobstructive intrarenal calculi are noted bilaterally. No evidence of hydronephrosis. Stomach/Bowel: Tiny hiatal hernia. No evidence of obstruction, inflammatory process or abnormal fluid collections. Diffuse colonic diverticulosis, without evidence of diverticulitis. Vascular/Lymphatic: No pathologically enlarged lymph nodes identified, however tiny sub-cm peripancreatic lymph nodes are seen, and early lymph node metastases cannot be excluded. No abdominal aortic aneurysm. Aortic atherosclerosis. Reproductive:  No mass identified.  Other: Minimal ascites in right upper quadrant and pelvis. Small left inguinal hernia containing only fat. Musculoskeletal:  No suspicious bone lesions identified. IMPRESSION: 6.5 cm mass in the pancreatic body and tail, consistent with primary pancreatic carcinoma. This mass involves the splenic artery and vein, as well as the superior mesenteric vein. Diffuse liver metastases. Superior mesenteric and portal vein thrombosis noted. Tiny sub-cm peripancreatic lymph nodes are not pathologically enlarged, however lymph node metastases cannot be excluded. Indeterminate 2 cm left adrenal mass. Electronically Signed   By: Earle Gell M.D.   On: 08/11/2016 12:01    Labs:  CBC:  Recent Labs  08/15/16 0940  WBC 12.2*  HGB 11.4*  HCT 34.0*  PLT 215    COAGS:  Recent Labs  08/15/16 0940  INR 1.38  APTT 32    BMP: No results for input(s): NA, K, CL, CO2, GLUCOSE, BUN, CALCIUM, CREATININE, GFRNONAA, GFRAA in the last 8760 hours.  Invalid input(s): CMP  LIVER FUNCTION TESTS: No results for input(s): BILITOT, AST, ALT, ALKPHOS, PROT, ALBUMIN in the last 8760 hours.  TUMOR MARKERS:  Recent Labs  08/15/16 0940  CA199 [id number]*    Assessment and Plan:  For US guided biopsy of liver lesion today.  Consent obtained.  Risks and benefits discussed with the patient including, but not limited to bleeding, infection, damage  to adjacent structures or low yield requiring additional tests. All of the patient's questions were answered, patient is agreeable to proceed. Consent signed and in chart.  Thank you for this interesting consult.  I greatly enjoyed meeting Ethan Henry and look forward to participating in their care.  A copy of this report was sent to the requesting provider on this date.  Electronically SignedAletta Edouard T 08/18/2016, 11:21 AM   I spent a total of  30 Minutes in face to face in clinical consultation, greater than 50% of which was counseling/coordinating care  for liver biopsy.

## 2016-08-18 NOTE — Procedures (Signed)
Interventional Radiology Procedure Note  Procedure:  US guided biopsy of liver lesion  Complications: None  Estimated Blood Loss: < 10 mL  Left lobe liver lesions sampled with 18 G core biopsy x 3 via 17 G needle.  Multiple vague, hyperechoic lesions present.  Venetia Night. Kathlene Cote, M.D Pager:  [phone]

## 2016-08-18 NOTE — Telephone Encounter (Signed)
Entered in error

## 2016-08-18 NOTE — Discharge Instructions (Signed)
Liver Biopsy, Care After °Introduction °Refer to this sheet in the next few weeks. These instructions provide you with information on caring for yourself after your procedure. Your health care provider may also give you more specific instructions. Your treatment has been planned according to current medical practices, but problems sometimes occur. Call your health care provider if you have any problems or questions after your procedure. °What can I expect after the procedure? °After your procedure, it is typical to have the following: °· A small amount of discomfort in the area where the biopsy was done and in the right shoulder or shoulder blade. °· A small amount of bruising around the area where the biopsy was done and on the skin over the liver. °· Sleepiness and fatigue for the rest of the day. °Follow these instructions at home: °· Rest at home for 1-2 days or as directed by your health care provider. °· Have a friend or family member stay with you for at least 24 hours. °· Because of the medicines used during the procedure, you should not do the following things in the first 24 hours: °¨ Drive. °¨ Use machinery. °¨ Be responsible for the care of other people. °¨ Sign legal documents. °¨ Take a bath or shower. °· There are many different ways to close and cover an incision, including stitches, skin glue, and adhesive strips. Follow your health care provider's instructions on: °¨ Incision care. °¨ Bandage (dressing) changes and removal. °¨ Incision closure removal. °· Do not drink alcohol in the first week. °· Do not lift more than 5 pounds or play contact sports for 2 weeks after this test. °· Take medicines only as directed by your health care provider. Do not take medicine containing aspirin or non-steroidal anti-inflammatory medicines such as ibuprofen for 1 week after this test. °· It is your responsibility to get your test results. °Contact a health care provider if: °· You have increased bleeding from an  incision that results in more than a small spot of blood. °· You have redness, swelling, or increasing pain in any incisions. °· You notice a discharge or a bad smell coming from any of your incisions. °· You have a fever or chills. °Get help right away if: °· You develop swelling, bloating, or pain in your abdomen. °· You become dizzy or faint. °· You develop a rash. °· You are nauseous or vomit. °· You have difficulty breathing, feel short of breath, or feel faint. °· You develop chest pain. °· You have problems with your speech or vision. °· You have trouble balancing or moving your arms or legs. °This information is not intended to replace advice given to you by your health care provider. Make sure you discuss any questions you have with your health care provider. °Document Released: 01/14/2005 Document Revised: 12/03/2015 Document Reviewed: 08/23/2013 °© 2017 Elsevier ° °

## 2016-08-18 NOTE — Telephone Encounter (Signed)
Today I received a telephone call that the patient was concerned that the he was suppose to get a bone marrow biopsy instead of a liver biopsy. AFter review of my note and physicians notes and orders, I incorrectly told the patient that it was bone marrow.  At this time the ultrasound staff have informed the patient that it was his liver biopsy as discussed by MD at his appointment and he was ok to proceed.

## 2016-08-22 ENCOUNTER — Other Ambulatory Visit: Payer: Self-pay | Admitting: Anatomic Pathology & Clinical Pathology

## 2016-08-22 LAB — SURGICAL PATHOLOGY

## 2016-08-23 ENCOUNTER — Telehealth: Payer: Self-pay | Admitting: *Deleted

## 2016-08-23 ENCOUNTER — Encounter
Admission: RE | Admit: 2016-08-23 | Discharge: 2016-08-23 | Disposition: A | Discharge status: Home / Self Care | Payer: PPO | Source: Ambulatory Visit | Attending: Hematology and Oncology | Admitting: Hematology and Oncology

## 2016-08-23 DIAGNOSIS — K869 Disease of pancreas, unspecified: Secondary | ICD-10-CM | POA: Diagnosis not present

## 2016-08-23 DIAGNOSIS — K8689 Other specified diseases of pancreas: Secondary | ICD-10-CM

## 2016-08-23 DIAGNOSIS — C787 Secondary malignant neoplasm of liver and intrahepatic bile duct: Secondary | ICD-10-CM | POA: Diagnosis not present

## 2016-08-23 LAB — GLUCOSE, CAPILLARY
Glucose-Capillary: 294 mg/dL — ABNORMAL HIGH (ref 65–99)
Glucose-Capillary: 305 mg/dL — ABNORMAL HIGH (ref 65–99)

## 2016-08-23 NOTE — Telephone Encounter (Signed)
Per Dr Humberto Seals, patient needs to contact Dr Gilford Rile regarding glucose control

## 2016-08-23 NOTE — Telephone Encounter (Signed)
Patient called to inform him of the need for glucose to be less than 200 consistently to get his PET scan done.  Patient instructed to keep diary of fsbs in the am fasting and we would check in a week, as patient was to increase his actos to 45 per Dr. Gilford Rile.voiced understanding.

## 2016-08-23 NOTE — Telephone Encounter (Signed)
Per Cecille Rubin at Dr [facility] office Dr Gilford Rile is having patient resume his Actos 45 mg and has notified the patient of this. He does not have a fu with Dr Gilford Rile until April. How do you want to handle reordering the PET scan?

## 2016-08-23 NOTE — Telephone Encounter (Signed)
Unable to do PET Scan this morning due to High glucose of 305, repeat 294. Reports that patient states a doctor told him to stop his Actos a few weeks ago because of diarrhea and has not been started on anything to replace it. Cannot reschedule PET until his sugars are under control. Dr Vonzella Nipple III stopped his Actos on 07/22/16.

## 2016-08-23 NOTE — Telephone Encounter (Signed)
I spoke with Dr Derry Skill office as well as forwarding the message to him and will await answer regarding diabetes medication

## 2016-08-23 NOTE — Telephone Encounter (Signed)
Called patient and informed him of the need for blood glucose to be less than 200 and to keep diary of blood sugars first thing in the am, fasting and that we would check in a week to reschedule PET. Voiced understanding.

## 2016-08-25 ENCOUNTER — Inpatient Hospital Stay: Payer: PPO | Admitting: Hematology and Oncology

## 2016-08-25 ENCOUNTER — Other Ambulatory Visit: Payer: Self-pay | Admitting: Hematology and Oncology

## 2016-08-25 ENCOUNTER — Other Ambulatory Visit (INDEPENDENT_AMBULATORY_CARE_PROVIDER_SITE_OTHER): Payer: Self-pay

## 2016-08-25 ENCOUNTER — Telehealth: Payer: Self-pay | Admitting: *Deleted

## 2016-08-25 ENCOUNTER — Other Ambulatory Visit: Payer: Self-pay | Admitting: *Deleted

## 2016-08-25 ENCOUNTER — Inpatient Hospital Stay: Payer: PPO

## 2016-08-25 DIAGNOSIS — C259 Malignant neoplasm of pancreas, unspecified: Secondary | ICD-10-CM

## 2016-08-25 DIAGNOSIS — C787 Secondary malignant neoplasm of liver and intrahepatic bile duct: Secondary | ICD-10-CM

## 2016-08-25 LAB — COMPREHENSIVE METABOLIC PANEL
ALT: 63 U/L (ref 17–63)
AST: 74 U/L — ABNORMAL HIGH (ref 15–41)
Albumin: 3.1 g/dL — ABNORMAL LOW (ref 3.5–5.0)
Alkaline Phosphatase: 634 U/L — ABNORMAL HIGH (ref 38–126)
Anion gap: 10 (ref 5–15)
BUN: 51 mg/dL — ABNORMAL HIGH (ref 6–20)
CO2: 20 mmol/L — ABNORMAL LOW (ref 22–32)
Calcium: 8.7 mg/dL — ABNORMAL LOW (ref 8.9–10.3)
Chloride: 99 mmol/L — ABNORMAL LOW (ref 101–111)
Creatinine, Ser: 2.13 mg/dL — ABNORMAL HIGH (ref 0.61–1.24)
GFR calc Af Amer: 32 mL/min — ABNORMAL LOW (ref >60)
GFR calc non Af Amer: 27 mL/min — ABNORMAL LOW (ref >60)
Glucose, Bld: 351 mg/dL — ABNORMAL HIGH (ref 65–99)
Potassium: 4.3 mmol/L (ref 3.5–5.1)
Sodium: 129 mmol/L — ABNORMAL LOW (ref 135–145)
Total Bilirubin: 1.8 mg/dL — ABNORMAL HIGH (ref 0.3–1.2)
Total Protein: 7.4 g/dL (ref 6.5–8.1)

## 2016-08-25 NOTE — Progress Notes (Signed)
START ON PATHWAY REGIMEN - Pancreatic  PANOS51: Nab-Paclitaxel (Abraxane) 125 mg/m2 D1, 8, 15 + Gemcitabine 1,000 mg/m2 D1, 8, 15 q28 Days Until Progression or Toxicity   A cycle is every 28 days:     Nab-paclitaxel (protein bound) (Abraxane(R)) 125 mg/m2 in NS to a concentration of 5 mg/mL given IV over 30 mins days 1, 8, and 15 followed by Dose Mod: None     Gemcitabine (Gemzar(R)) 1000 mg/m2 in 250 mL NS IV over 30 minutes days 1, 8, and 15. Dose Mod: None Additional Orders: Hold therapy and notify physician if [place] < 1500.  **Always confirm dose/schedule in your pharmacy ordering system**    Patient Characteristics: Adenocarcinoma, Metastatic Disease, First Line, PS >= 2 Current evidence of distant metastases? Yes AJCC T Stage: 3 AJCC N Stage: X AJCC Stage Grouping: IV AJCC M Stage: 1 Histology: Adenocarcinoma Line of therapy: First Line Would you be surprised if this patient died  in the next year? I would be surprised if this patient died in the next year  Intent of Therapy: Non-Curative / Palliative Intent, Discussed with Patient

## 2016-08-25 NOTE — Progress Notes (Deleted)
[facility] day:  08/25/2016  Chief Complaint: Ethan Henry is a 83 y.o. male with a pancreatic mass and liver metastasis who is seen for assessment after interval liver biopsy.  HPI:   The patient was last seen in the medical oncology clinic on 08/15/2016.  At that time, he was seen for initial assessment for probable metastatic pancreatic cancer.  He presented with diarrhea, weight loss, a 6.5 cm mass in the pancreatic body and tail, and liver metastasis.  There was thrombus in the superior mesenteric and portal vein.  CA19-9 was 124,323 on 08/15/2016.  Ultrasound guided liver biopsy on 08/18/2016 revealed malignant cells within the portal lymphatic, consistent with metastatic carcinoma.  Morphologically, the cells were compatible with metastatic pancreatic adenocarcinoma.  He was scheduled for PET scan, but was postponed secondary to hyperglycemia.    Past Medical History:  Diagnosis Date  . Diabetes mellitus without complication ([place])   . Hypertension   . Renal insufficiency    stage 2 renal dz.     Past Surgical History:  Procedure Laterality Date  . Partial shoulder arthroscopy      No family history on file.  Social History:  reports that he has been smoking.  He has been smoking about 1.50 packs per day. He has never used smokeless tobacco. His alcohol and drug histories are not on file.  He drinks beer on the weekend.  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  He lives in [place].  The patient is accompanied by his wife, Arbie Cookey, and his daughter, Page, today.  Allergies:  Allergies  Allergen Reactions  . Glipizide Rash    Current Medications: Current Outpatient Prescriptions  Medication Sig Dispense Refill  . amLODipine (NORVASC) 5 MG tablet     . aspirin EC 81 MG tablet Take by mouth.    . Blood Glucose Calibration (OT ULTRA/FASTTK CNTRL SOLN) SOLN     . Cholecalciferol (VITAMIN D-1000 MAX ST) 1000 units  tablet Take by mouth.    . Cyanocobalamin (RA VITAMIN B-12 TR) 1000 MCG TBCR Take by mouth.    Marland Kitchen glucose blood (ONE TOUCH ULTRA TEST) test strip     . Lancets (ONETOUCH ULTRASOFT) lancets     . losartan-hydrochlorothiazide (HYZAAR) 100-25 MG tablet TAKE ONE (1) TABLET BY MOUTH EVERY DAY    . meloxicam (MOBIC) 7.5 MG tablet TAKE ONE (1) TABLET BY MOUTH EVERY DAY    . Omega-3 Fatty Acids (FISH OIL PO) Take by mouth.    . pioglitazone (ACTOS) 45 MG tablet Take 45 mg by mouth daily.    . pravastatin (PRAVACHOL) 40 MG tablet TAKE ONE TABLET EACH EVENING AS DIRECTED    . vitamin C (ASCORBIC ACID) 500 MG tablet Take by mouth.     No current facility-administered medications for this visit.     Review of Systems:  GENERAL:  Feels weak.  No fevers or sweats .  Weight loss of 26 pounds (151 pounds to 125 pounds) since 04/2016. PERFORMANCE STATUS (ECOG):  1 HEENT:  No visual changes, runny nose, sore throat, mouth sores or tenderness. Lungs: No shortness of breath or cough.  No hemoptysis. Cardiac:  No chest pain, palpitations, orthopnea, or PND. GI:  No nausea, vomiting, constipation, melena or hematochezia.  1 bowel movement a day, but periodically has 3-4/day.  No prior colonoscopy. GU:  No urgency, frequency, dysuria, or hematuria. Musculoskeletal:  No back pain.  Intermittent right shoulder pain.  No muscle tenderness. Extremities:  No pain or swelling. Skin:  No rashes or skin changes. Neuro:  No headache, numbness or weakness, balance or coordination issues. Endocrine:  No diabetes, thyroid issues, hot flashes or night sweats. Psych:  No mood changes, depression or anxiety. Pain:  No focal pain. Review of systems:  All other systems reviewed and found to be negative.  Physical Exam: There were no vitals taken for this visit. GENERAL:  Well developed, well nourished, gentleman sitting comfortably in the exam room in no acute distress. MENTAL STATUS:  Alert and oriented to person, place  and time. HEAD:  Pearline Cables hair.  Normocephalic, atraumatic, face symmetric, no Cushingoid features. EYES:  Glasses.  Dark eyes.  Pupils equal round and reactive to light and accomodation.  No conjunctivitis or scleral icterus. ENT:  Oropharynx clear without lesion.  Upper and lower dentures.  Tongue normal.  Mucous membranes moist.  RESPIRATORY:  Clear to auscultation without rales, wheezes or rhonchi. CARDIOVASCULAR:  Regular rate and rhythm without murmur, rub or gallop. ABDOMEN:  Scaphoid.  Soft, non-tender, with active bowel sounds, and no hepatosplenomegaly.  No masses. BACK:  Sacral pain on palpation.   SKIN:  No rashes, ulcers or lesions. EXTREMITIES: No edema, no skin discoloration or tenderness.  No palpable cords. LYMPH NODES: No palpable cervical, supraclavicular, axillary or inguinal adenopathy  NEUROLOGICAL: Unremarkable. PSYCH:  Appropriate.  Hospital Outpatient Visit on 08/23/2016  Component Date Value Ref Range Status  . Glucose-Capillary 08/23/2016 305* 65 - 99 mg/dL Final  . Glucose-Capillary 08/23/2016 294* 65 - 99 mg/dL Final    Assessment:  Ethan Henry is a 83 y.o. male with metastatic pancreatic cancer.  He presented with diarrhea and weight loss.  He has lost 26 pounds since 04/2016.  Abdomen and pelvic CT scan on 08/11/2016 revealed a 6.5 cm mass in the pancreatic body and tail consistent with primary pancreatic carcinoma.  The mass involved the splenic artery and vein as well as the superior mesenteric vein.  There was diffuse liver metastasis.  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 There was tiny subcentimeter peripancreatic lymph nodes. There was an indeterminate 2 cm left adrenal mass.  CA19-9 was 124,323 on 08/15/2016.  He has a normocytic anemia. Ferritin and iron studies were normal on 07/22/2016.  Creatinine was 1.7.  One of 2 guaiac cards were positive on 08/08/2016.  He has never had a colonoscopy.  Symptomatically, he a 26 pound weight  loss since 04/2016.  Exam reveals no palpable masses.  Plan: 1.  Review results from pancreatic biopsy.  Discuss systemic chemotherapy. 2.  Discuss initiation of anticoagulation for portal vein thrombosis. 3.  Labs today:  CBC with diff, CMP. 4.  Presnt at tumor board today. 5.  Preauth gemcitabine and Abraxane. 6.  Chemotherapy class. 7.  Schedule port placement.   Lequita Asal, MD  08/25/2016, 4:36 AM

## 2016-08-25 NOTE — Telephone Encounter (Signed)
Called patient to come to cancer center for lab test today, voiced understanding, that lab test was needed to start blood thinners.

## 2016-08-25 NOTE — Telephone Encounter (Signed)
Called patient and informed him of appt to see Dr. Mike Gip tomorrow Friday 08-26-16 at 245 voiced understanding and agreement to come to appt.

## 2016-08-26 ENCOUNTER — Inpatient Hospital Stay (HOSPITAL_BASED_OUTPATIENT_CLINIC_OR_DEPARTMENT_OTHER): Payer: PPO | Admitting: Hematology and Oncology

## 2016-08-26 VITALS — BP 112/76 | HR 111 | Temp 94.6°F | Resp 18 | Wt 125.7 lb

## 2016-08-26 DIAGNOSIS — C259 Malignant neoplasm of pancreas, unspecified: Secondary | ICD-10-CM

## 2016-08-26 DIAGNOSIS — F17210 Nicotine dependence, cigarettes, uncomplicated: Secondary | ICD-10-CM | POA: Diagnosis not present

## 2016-08-26 DIAGNOSIS — Z79899 Other long term (current) drug therapy: Secondary | ICD-10-CM

## 2016-08-26 DIAGNOSIS — Z7189 Other specified counseling: Secondary | ICD-10-CM | POA: Insufficient documentation

## 2016-08-26 DIAGNOSIS — C787 Secondary malignant neoplasm of liver and intrahepatic bile duct: Secondary | ICD-10-CM

## 2016-08-26 DIAGNOSIS — I81 Portal vein thrombosis: Secondary | ICD-10-CM | POA: Diagnosis not present

## 2016-08-26 DIAGNOSIS — N289 Disorder of kidney and ureter, unspecified: Secondary | ICD-10-CM

## 2016-08-26 NOTE — Progress Notes (Signed)
[facility] day:  08/26/2016  Chief Complaint: DOMENICK QUEBEDEAUX is a 83 y.o. male with a pancreatic mass and liver metastasis who is seen for assessment after interval liver biopsy.  HPI:   The patient was last seen in the medical oncology clinic on 08/15/2016.  At that time, he was seen for initial assessment for probable metastatic pancreatic cancer.  He presented with diarrhea, weight loss, a 6.5 cm mass in the pancreatic body and tail, and liver metastasis.  There was thrombus in the superior mesenteric and portal vein.  CA19-9 was 124,323 on 08/15/2016.  Ultrasound guided liver biopsy on 08/18/2016 revealed malignant cells within the portal lymphatic, consistent with metastatic carcinoma.  Morphologically, the cells were compatible with metastatic pancreatic adenocarcinoma.  He was presented at tumor board on 08/25/2016.  He does not require a repeat biopsy.  He was scheduled for PET scan, but was postponed secondary to hyperglycemia.  During the interim, he denies any new complaints.  He denies any pain.  He has a lot of questions about imaging, direction of therapy and port placement   Past Medical History:  Diagnosis Date  . Diabetes mellitus without complication ([place])   . Hypertension   . Renal insufficiency    stage 2 renal dz.     Past Surgical History:  Procedure Laterality Date  . Partial shoulder arthroscopy      No family history on file.  Social History:  reports that he has been smoking.  He has been smoking about 1.50 packs per day. He has never used smokeless tobacco. His alcohol and drug histories are not on file.  He drinks beer on the weekend.  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  He lives in [place].  The patient is accompanied by his wife, Arbie Cookey, his daughter, Page, and son-in-law, Charlotte Crumb, today.  Allergies:  Allergies  Allergen Reactions  . Glipizide Rash    Current Medications: Current  Outpatient Prescriptions  Medication Sig Dispense Refill  . amLODipine (NORVASC) 5 MG tablet Take 5 mg by mouth daily.     Marland Kitchen aspirin EC 81 MG tablet Take 81 mg by mouth at bedtime.     . Blood Glucose Calibration (OT ULTRA/FASTTK CNTRL SOLN) SOLN     . Cholecalciferol (VITAMIN D-1000 MAX ST) 1000 units tablet Take 1,000 Units by mouth daily.     Marland Kitchen glucose blood (ONE TOUCH ULTRA TEST) test strip     . Lancets (ONETOUCH ULTRASOFT) lancets     . losartan-hydrochlorothiazide (HYZAAR) 100-25 MG tablet Take 1 tablet by mouth daily.    . meloxicam (MOBIC) 7.5 MG tablet Take 7.5 mg by mouth daily.    . Multiple Vitamin (MULTIVITAMIN WITH MINERALS) TABS tablet Take 1 tablet by mouth daily at 12 noon.    . Omega-3 Fatty Acids (FISH OIL PO) Take 1 capsule by mouth daily.     . pioglitazone (ACTOS) 45 MG tablet Take 45 mg by mouth daily.    . pravastatin (PRAVACHOL) 40 MG tablet Take 40 mg by mouth every evening.    . vitamin C (ASCORBIC ACID) 500 MG tablet Take 500 mg by mouth daily.      No current facility-administered medications for this visit.     Review of Systems:  GENERAL:  Feels fine.  No fevers or sweats .  Weight loss of 26 pounds (151 pounds to 125 pounds) since 04/2016.  Weight stable since 08/15/2016. PERFORMANCE STATUS (  ECOG):  1 HEENT:  No visual changes, runny nose, sore throat, mouth sores or tenderness. Lungs: No shortness of breath or cough.  No hemoptysis. Cardiac:  No chest pain, palpitations, orthopnea, or PND. GI:  No nausea, vomiting, constipation, melena or hematochezia.  1 bowel movement a day.  No prior colonoscopy. GU:  No urgency, frequency, dysuria, or hematuria. Musculoskeletal:  No back pain.  Intermittent right shoulder pain.  No muscle tenderness. Extremities:  No pain or swelling. Skin:  No rashes or skin changes. Neuro:  No headache, numbness or weakness, balance or coordination issues. Endocrine:  No diabetes, thyroid issues, hot flashes or night  sweats. Psych:  No mood changes, depression or anxiety. Pain:  No focal pain. Review of systems:  All other systems reviewed and found to be negative.  Physical Exam: Blood pressure 112/76, pulse (!) 111, temperature (!) 94.6 F (34.8 C), temperature source Tympanic, resp. rate 18, weight 125 lb 10.6 oz (57 kg). GENERAL:  Thin elderly gentleman sitting comfortably in the exam room in no acute distress. MENTAL STATUS:  Alert and oriented to person, place and time. HEAD:  Pearline Cables hair.  Normocephalic, atraumatic, face symmetric, no Cushingoid features. EYES:  Glasses.  Dark eyes.  No conjunctivitis or scleral icterus. NEUROLOGICAL: Unremarkable. PSYCH:  Appropriate.   Appointment on 08/25/2016  Component Date Value Ref Range Status  . Sodium 08/25/2016 129* 135 - 145 mmol/L Final  . Potassium 08/25/2016 4.3  3.5 - 5.1 mmol/L Final  . Chloride 08/25/2016 99* 101 - 111 mmol/L Final  . CO2 08/25/2016 20* 22 - 32 mmol/L Final  . Glucose, Bld 08/25/2016 351* 65 - 99 mg/dL Final  . BUN 08/25/2016 51* 6 - 20 mg/dL Final  . Creatinine, Ser 08/25/2016 2.13* 0.61 - 1.24 mg/dL Final  . Calcium 08/25/2016 8.7* 8.9 - 10.3 mg/dL Final  . Total Protein 08/25/2016 7.4  6.5 - 8.1 g/dL Final  . Albumin 08/25/2016 3.1* 3.5 - 5.0 g/dL Final  . AST 08/25/2016 74* 15 - 41 U/L Final  . ALT 08/25/2016 63  17 - 63 U/L Final  . Alkaline Phosphatase 08/25/2016 634* 38 - 126 U/L Final  . Total Bilirubin 08/25/2016 1.8* 0.3 - 1.2 mg/dL Final  . GFR calc non Af Amer 08/25/2016 27* >60 mL/min Final  . GFR calc Af Amer 08/25/2016 32* >60 mL/min Final   Comment: (NOTE) The eGFR has been calculated using the CKD EPI equation. This calculation has not been validated in all clinical situations. eGFR's persistently <60 mL/min signify possible Chronic Kidney Disease.   . Anion gap 08/25/2016 10  5 - 15 Final    Assessment:  YOAV OKANE is a 83 y.o. male with metastatic pancreatic cancer.  He presented with  diarrhea and weight loss.  He has lost 26 pounds since 04/2016.  Abdomen and pelvic CT scan on 08/11/2016 revealed a 6.5 cm mass in the pancreatic body and tail consistent with primary pancreatic carcinoma.  The mass involved the splenic artery and vein as well as the superior mesenteric vein.  There was diffuse liver metastasis.  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 There was tiny subcentimeter peripancreatic lymph nodes. There was an indeterminate 2 cm left adrenal mass.  CA19-9 was 124,323 on 08/15/2016.  He was unable to have a PET scan secondary to hyperglycemia.  He wishes to defer imaging.  He has a normocytic anemia. Ferritin and iron studies were normal on 07/22/2016.  One of 2 guaiac cards were positive  on 08/08/2016.  He has never had a colonoscopy.  He has renal insufficiency.  Creatinine was 1.7, but has increased to 2.13 (CrCl 22-25 ml/min).  He has liver dysfunction (bilirubin 1.8 on 08/25/2016).  Symptomatically, he feels "ok".  He denies any pain.  Exam reveals no palpable masses.  He has hyponatremia.  Code status is DNR/DNI.  Plan: 1.  Review results from pancreatic biopsy.  Biopsy confirms adenocarcinoma.  In conjunction with his markedly elevated CA 19-9, diagnosis is consistent with metastatic pancreatic cancer. Treatment is palliative.  Discuss supportive care versus palliative chemotherapy. Discuss gemcitabine versus gemcitabine plus Abraxane. Side effects were reviewed in detail.  Discuss issues with liver function abnormalities and Abraxane.  Discuss benefit of gemcitabine alone improving quality of life versus supportive care. Supportive care discussed in some detail.  Patient wishes to try chemotherapy. 2.  Discuss attending the chemotherapy class. 3.  Discuss chemotherapy via peripheral IV versus port-a-cath. Port placement was described in detail.  After a long discussion, he wishes to pursue port placement. Port placement is scheduled for 08/29/2016.   Per Dr. Lucky Cowboy, he can be on anticoagulation. 4.  Discuss issues with recent PET scan.  Results of scan will not change his treatment. It will only further define the extent of his disease and serve as a baseline for future imaging comparison.  Suspect lymph nodes would be positive positive on PET scan. Discuss consideration of a non-contrasted chest CT to assess for any pulmonary nodules. After some discussion, patient wished to cancel his PET scan.  No decision was made about a chest CT. 5.  Discuss anticoagulation.  Discuss issues with renal function and his age.  Discuss heparin, Lovenox, Coumadin, Xarelto, Eliquis, Pradaxa.  Discuss need for bridging with Lovenox with Coumadin.  Discuss conversation with pharmacy.  Discuss cautiously proceeding with low dose Eliquis.  Risks and benefits discussed in detail. 6.  Discuss mild hyponatremia.  Discuss fluids with electrolytes. 7.  Discuss code status.  He does not want heroic measures. CODE STATUS is DO NOT RESUSCITATE/DO NOT INTUBATE. 8.  Port placement on 08/29/2016. 9.  Preauth gemcitabine and Abraxane. 10.  Chemotherapy class on Tuesday (08/30/2016). 11.  Rx:  Eliquis 2.5 mg po BID.  Adjust dose based on renal function. 12.  RTC next week for MD assessment, labs (CBC with diff, CMP, Mg), and gemcitabine +/- Abraxane.   Lequita Asal, MD  08/26/2016, 3:23 PM

## 2016-08-26 NOTE — Progress Notes (Signed)
Patient states he is having a little bit of diarrhea.  Thinks it is because of going back on Actos.

## 2016-08-27 ENCOUNTER — Encounter: Payer: Self-pay | Admitting: Hematology and Oncology

## 2016-08-27 MED ORDER — APIXABAN 2.5 MG PO TABS: 2.5000 mg | ORAL_TABLET | Freq: Two times a day (BID) | ORAL | 0 refills | Status: AC

## 2016-08-29 ENCOUNTER — Other Ambulatory Visit: Payer: Self-pay | Admitting: *Deleted

## 2016-08-29 ENCOUNTER — Ambulatory Visit
Admission: RE | Admit: 2016-08-29 | Discharge: 2016-08-29 | Disposition: A | Discharge status: Home / Self Care | Payer: PPO | Source: Ambulatory Visit | Attending: Vascular Surgery | Admitting: Vascular Surgery

## 2016-08-29 ENCOUNTER — Encounter
Admission: RE | Disposition: A | Discharge status: Home / Self Care | Payer: Self-pay | Source: Ambulatory Visit | Attending: Vascular Surgery

## 2016-08-29 ENCOUNTER — Encounter: Payer: Self-pay | Admitting: *Deleted

## 2016-08-29 DIAGNOSIS — E785 Hyperlipidemia, unspecified: Secondary | ICD-10-CM | POA: Diagnosis not present

## 2016-08-29 DIAGNOSIS — F17210 Nicotine dependence, cigarettes, uncomplicated: Secondary | ICD-10-CM | POA: Insufficient documentation

## 2016-08-29 DIAGNOSIS — C259 Malignant neoplasm of pancreas, unspecified: Secondary | ICD-10-CM | POA: Diagnosis not present

## 2016-08-29 DIAGNOSIS — N184 Chronic kidney disease, stage 4 (severe): Secondary | ICD-10-CM | POA: Insufficient documentation

## 2016-08-29 DIAGNOSIS — I129 Hypertensive chronic kidney disease with stage 1 through stage 4 chronic kidney disease, or unspecified chronic kidney disease: Secondary | ICD-10-CM | POA: Insufficient documentation

## 2016-08-29 DIAGNOSIS — E1122 Type 2 diabetes mellitus with diabetic chronic kidney disease: Secondary | ICD-10-CM | POA: Insufficient documentation

## 2016-08-29 DIAGNOSIS — Z888 Allergy status to other drugs, medicaments and biological substances status: Secondary | ICD-10-CM | POA: Diagnosis not present

## 2016-08-29 DIAGNOSIS — I10 Essential (primary) hypertension: Secondary | ICD-10-CM | POA: Diagnosis not present

## 2016-08-29 DIAGNOSIS — C787 Secondary malignant neoplasm of liver and intrahepatic bile duct: Secondary | ICD-10-CM | POA: Diagnosis not present

## 2016-08-29 HISTORY — DX: Hyperlipidemia, unspecified: E78.5

## 2016-08-29 HISTORY — PX: PORTA CATH INSERTION: CATH118285

## 2016-08-29 SURGERY — PORTA CATH INSERTION
Anesthesia: Moderate Sedation

## 2016-08-29 MED ORDER — FENTANYL CITRATE (PF) 100 MCG/2ML IJ SOLN
INTRAMUSCULAR | Status: DC | PRN
  Administered 2016-08-29: 50 ug via INTRAVENOUS
  Administered 2016-08-29 (×2): 25 ug via INTRAVENOUS

## 2016-08-29 MED ORDER — FENTANYL CITRATE (PF) 100 MCG/2ML IJ SOLN
INTRAMUSCULAR | Status: AC
  Filled 2016-08-29: qty 2

## 2016-08-29 MED ORDER — LIDOCAINE-EPINEPHRINE (PF) 2 %-1:200000 IJ SOLN
INTRAMUSCULAR | Status: AC
  Filled 2016-08-29: qty 20

## 2016-08-29 MED ORDER — HEPARIN (PORCINE) IN NACL 2-0.9 UNIT/ML-% IJ SOLN
INTRAMUSCULAR | Status: AC
  Filled 2016-08-29: qty 500

## 2016-08-29 MED ORDER — MIDAZOLAM HCL 5 MG/5ML IJ SOLN
INTRAMUSCULAR | Status: AC
  Filled 2016-08-29: qty 5

## 2016-08-29 MED ORDER — SODIUM CHLORIDE 0.9 % IV SOLN: INTRAVENOUS | Status: DC

## 2016-08-29 MED ORDER — SODIUM CHLORIDE 0.9 % IR SOLN
Freq: Once | Status: DC
  Filled 2016-08-29 (×2): qty 2

## 2016-08-29 MED ORDER — CEFAZOLIN IN D5W 1 GM/50ML IV SOLN
1.0000 g | Freq: Once | INTRAVENOUS | Status: AC
  Administered 2016-08-29: 1 g via INTRAVENOUS

## 2016-08-29 MED ORDER — CEFAZOLIN IN D5W 1 GM/50ML IV SOLN: 1.0000 g | Freq: Once | INTRAVENOUS | Status: AC

## 2016-08-29 MED ORDER — MIDAZOLAM HCL 2 MG/2ML IJ SOLN
INTRAMUSCULAR | Status: DC | PRN
  Administered 2016-08-29: 2 mg via INTRAVENOUS
  Administered 2016-08-29 (×2): 1 mg via INTRAVENOUS

## 2016-08-29 MED ORDER — CEFAZOLIN IN D5W 1 GM/50ML IV SOLN
INTRAVENOUS | Status: AC
  Filled 2016-08-29: qty 50

## 2016-08-29 SURGICAL SUPPLY — 10 items
BAG DECANTER STRL (MISCELLANEOUS) ×3 IMPLANT
KIT PORT POWER 8FR ISP CVUE (Catheter) ×3 IMPLANT
PACK ANGIOGRAPHY (CUSTOM PROCEDURE TRAY) ×3 IMPLANT
PAD GROUND ADULT SPLIT (MISCELLANEOUS) ×3 IMPLANT
PENCIL ELECTRO HAND CTR (MISCELLANEOUS) ×3 IMPLANT
PREP CHG 10.5 TEAL (MISCELLANEOUS) ×3 IMPLANT
SUT MNCRL AB 4-0 PS2 18 (SUTURE) ×3 IMPLANT
SUT PROLENE 0 CT 1 30 (SUTURE) ×3 IMPLANT
SUTURE VIC 3-0 (SUTURE) ×3 IMPLANT
TOWEL OR 17X26 4PK STRL BLUE (TOWEL DISPOSABLE) ×3 IMPLANT

## 2016-08-29 NOTE — Progress Notes (Signed)
Patient here today for porta cath placement per DR [facility]. Vitals stable, attached to monitor,will continue to monitor vitals as well as etco2 during and throughout entire procedure.

## 2016-08-29 NOTE — H&P (Signed)
[facility] Admission History & Physical  MRN : [id number]  Ethan Henry is a 83 y.o. (08/24/1932) male who presents with chief complaint of No chief complaint on file. Marland Kitchen  History of Present Illness: patient is referred from Dr. Mike Gip for port a cath placement.  He has a recent diagnosis of pancreatic cancer with probable liver metastases. He will need a Port-A-Cath for chemotherapy and durable venous access. He has been having abdominal pain. He continues to smoke. He remains active. He has no fever or chills. He has had a 26 pound weight loss in the last 4 months. He has some weakness and lethargy. No change in bowel habits.  Current Facility-Administered Medications  Medication Dose Route Frequency Provider Last Rate Last Dose  . 0.9 %  sodium chloride infusion   Intravenous Continuous Algernon Huxley, MD      . gentamicin (GARAMYCIN) 80 mg in sodium chloride irrigation 0.9 % 500 mL irrigation   Irrigation Once Algernon Huxley, MD        Past Medical History:  Diagnosis Date  . Diabetes mellitus without complication ([place])   . Hyperlipidemia   . Hypertension   . Renal insufficiency    stage 2 renal dz.     Past Surgical History:  Procedure Laterality Date  . Partial shoulder arthroscopy      Social History Social History  Substance Use Topics  . Smoking status: Current Some Day Smoker    Packs/day: 1.50  . Smokeless tobacco: Never Used     Comment: Smokes only on weekends  . Alcohol use Yes     Comment: occasional  Married  Family History No bleeding disorders, clotting disorders, autoimmune diseases, or aneurysms  Allergies  Allergen Reactions  . Glipizide Rash     REVIEW OF SYSTEMS (Negative unless checked)  Constitutional: [x] Weight loss  [] Fever  [] Chills Cardiac: [] Chest pain   [] Chest pressure   [] Palpitations   [] Shortness of breath when laying flat   [] Shortness of breath at rest   [] Shortness of breath with exertion. Vascular:   [] Pain in legs with walking   [] Pain in legs at rest   [] Pain in legs when laying flat   [] Claudication   [] Pain in feet when walking  [] Pain in feet at rest  [] Pain in feet when laying flat   [] History of DVT   [] Phlebitis   [] Swelling in legs   [] Varicose veins   [] Non-healing ulcers Pulmonary:   [] Uses home oxygen   [] Productive cough   [] Hemoptysis   [] Wheeze  [] COPD   [] Asthma Neurologic:  [] Dizziness  [] Blackouts   [] Seizures   [] History of stroke   [] History of TIA  [] Aphasia   [] Temporary blindness   [] Dysphagia   [] Weakness or numbness in arms   [] Weakness or numbness in legs Musculoskeletal:  [] Arthritis   [] Joint swelling   [] Joint pain   [] Low back pain Hematologic:  [] Easy bruising  [] Easy bleeding   [] Hypercoagulable state   [] Anemic  [] Hepatitis Gastrointestinal:  [] Blood in stool   [] Vomiting blood  [] Gastroesophageal reflux/heartburn   [] Difficulty swallowing. Genitourinary:  [] Chronic kidney disease   [] Difficult urination  [] Frequent urination  [] Burning with urination   [] Blood in urine Skin:  [] Rashes   [] Ulcers   [] Wounds Psychological:  [] History of anxiety   []  History of major depression.  Physical Examination  Vitals:   08/29/16 1016  BP: 131/77  Pulse: (!) 104  Resp: 16  Temp: 97.6 F (36.4 C)  TempSrc: Oral  SpO2: 97%  Weight: 56.7 kg (125 lb)  Height: 5\' 7"  (1.702 m)   Body mass index is 19.58 kg/m. Gen: Thin, NAD Head: [place]/AT, No temporalis wasting. Prominent temp pulse not noted. Ear/Nose/Throat: Hearing grossly intact, nares w/o erythema or drainage, oropharynx w/o Erythema/Exudate,  Eyes: Conjunctiva clear, sclera non-icteric Neck: Trachea midline.  No JVD.  Pulmonary:  Good air movement, respirations not labored, no use of accessory muscles.  Cardiac: RRR, normal S1, S2. Vascular:  Vessel Right Left  Radial Palpable Palpable                                   Gastrointestinal: soft, non-tender/non-distended. No guarding/reflex.   Musculoskeletal: M/S 5/5 throughout.  Extremities without ischemic changes.  No deformity or atrophy.  Neurologic: Sensation grossly intact in extremities.  Symmetrical.  Speech is fluent. Motor exam as listed above. Psychiatric: Judgment intact, Mood & affect appropriate for pt's clinical situation. Dermatologic: No rashes or ulcers noted.  No cellulitis or open wounds. Lymph : No Cervical, Axillary, or Inguinal lymphadenopathy.     CBC Lab Results  Component Value Date   WBC 12.2 (H) 08/15/2016   HGB 11.4 (L) 08/15/2016   HCT 34.0 (L) 08/15/2016   MCV 93.0 08/15/2016   PLT 215 08/15/2016    BMET    Component Value Date/Time   NA 129 (L) 08/25/2016 1514   K 4.3 08/25/2016 1514   CL 99 (L) 08/25/2016 1514   CO2 20 (L) 08/25/2016 1514   GLUCOSE 351 (H) 08/25/2016 1514   BUN 51 (H) 08/25/2016 1514   CREATININE 2.13 (H) 08/25/2016 1514   CALCIUM 8.7 (L) 08/25/2016 1514   GFRNONAA 27 (L) 08/25/2016 1514   GFRAA 32 (L) 08/25/2016 1514   Estimated Creatinine Clearance: 21.8 mL/min (by C-G formula based on SCr of 2.13 mg/dL (H)).  COAG Lab Results  Component Value Date   INR 1.38 08/15/2016    Radiology Ct Abdomen Pelvis W Contrast  Result Date: 08/11/2016 CLINICAL DATA:  20 pound weight loss in past year. Elevated liver function tests. Diarrhea. EXAM: CT ABDOMEN AND PELVIS WITH CONTRAST TECHNIQUE: Multidetector CT imaging of the abdomen and pelvis was performed using the standard protocol following bolus administration of intravenous contrast. CONTRAST:  58mL ISOVUE-300 IOPAMIDOL (ISOVUE-300) INJECTION 61% COMPARISON:  None. FINDINGS: Lower Chest: No acute findings. Hepatobiliary: Diffuse low-attenuation liver masses, consistent with diffuse liver metastases. Index lesion in dome of right hepatic lobe measures 3.3 x 2.8 cm on image 12/2. Gallbladder is unremarkable. No evidence of biliary ductal dilatation. Pancreas: Solid-appearing mass in the pancreatic body and tail measures  6.5 x 3.0 cm. This is consistent with primary pancreatic carcinoma. This mass involves the splenic artery and vein, and superior mesenteric vein. 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 Spleen: Within normal limits in size and appearance. Adrenals/Urinary Tract: 2 cm left adrenal mass is indeterminate. Normal appearance of right adrenal gland. Multiple bilateral renal cysts are seen, however there is no evidence of renal mass or hydronephrosis. Tiny nonobstructive intrarenal calculi are noted bilaterally. No evidence of hydronephrosis. Stomach/Bowel: Tiny hiatal hernia. No evidence of obstruction, inflammatory process or abnormal fluid collections. Diffuse colonic diverticulosis, without evidence of diverticulitis. Vascular/Lymphatic: No pathologically enlarged lymph nodes identified, however tiny sub-cm peripancreatic lymph nodes are seen, and early lymph node metastases cannot be excluded. No abdominal aortic aneurysm. Aortic atherosclerosis. Reproductive:  No mass identified.  Other: Minimal ascites in right upper quadrant and pelvis. Small left inguinal hernia containing only fat. Musculoskeletal:  No suspicious bone lesions identified. IMPRESSION: 6.5 cm mass in the pancreatic body and tail, consistent with primary pancreatic carcinoma. This mass involves the splenic artery and vein, as well as the superior mesenteric vein. Diffuse liver metastases. Superior mesenteric and portal vein thrombosis noted. Tiny sub-cm peripancreatic lymph nodes are not pathologically enlarged, however lymph node metastases cannot be excluded. Indeterminate 2 cm left adrenal mass. Electronically Signed   By: Earle Gell M.D.   On: 08/11/2016 12:01   US Biopsy  Result Date: 08/18/2016 CLINICAL DATA:  Pancreatic mass with multiple liver lesions. EXAM: ULTRASOUND GUIDED CORE BIOPSY OF LIVER MEDICATIONS: 1.0 mg IV Versed;  mcg IV Fentanyl Total Moderate Sedation Time: 8 minutes. The patient's level of  consciousness and physiologic status were continuously monitored during the procedure by Radiology nursing. PROCEDURE: The procedure, risks, benefits, and alternatives were explained to the patient. Questions regarding the procedure were encouraged and answered. The patient understands and consents to the procedure. A time out was performed prior to initiating the procedure. The abdominal wall was prepped with chlorhexidine in a sterile fashion, and a sterile drape was applied covering the operative field. A sterile gown and sterile gloves were used for the procedure. Local anesthesia was provided with 1% Lidocaine. Ultrasound was performed of the liver. Under direct ultrasound guidance, a 17 gauge needle was advanced into the left lobe. Coaxial 18 gauge core biopsies were performed. Three separate core biopsy samples were submitted in formalin. COMPLICATIONS: None. FINDINGS: The liver is extremely heterogeneous with lesions demonstrating vague margination. Compared to the right lobe, it was felt that the left lobe would yield better tissue. Solid tissue was obtained. IMPRESSION: Ultrasound-guided core biopsy performed of lesions within the left lobe of the liver. Electronically Signed   By: Aletta Edouard M.D.   On: 08/18/2016 13:56      Assessment/Plan 1. Pancreatic cancer. Needs port for chemo.  Risks and benefits discussed 2. HTN. Stable on outpatient medications and blood pressure control important in reducing the progression of atherosclerotic disease. On appropriate oral medications. 3. Stage 4 CKD.  No planned use of contrast today.   Leotis Pain, MD  08/29/2016 10:41 AM

## 2016-08-29 NOTE — Op Note (Signed)
      [facility]       Operative Note  Date: 08/29/2016  Preoperative diagnosis:  1. Pancreatic cancer  Postoperative diagnosis:  Same as above  Procedures: #1. Ultrasound guidance for vascular access to the right internal jugular vein. #2. Fluoroscopic guidance for placement of catheter. #3. Placement of CT compatible Port-A-Cath, right internal jugular vein.  Surgeon: Leotis Pain, MD.   Anesthesia: Local with moderate conscious sedation for approximately 20 minutes using 4 mg of Versed and 100 mcg of Fentanyl  Fluoroscopy time: less than 1 minute  Contrast used: 0  Estimated blood loss: minimal  Indication for the procedure:  The patient is a 83 y.o.male with pancreatic cancer.  The patient needs a Port-A-Cath for durable venous access, chemotherapy, lab draws, and CT scans. We are asked to place this. Risks and benefits were discussed and informed consent was obtained.  Description of procedure: The patient was brought to the vascular and interventional radiology suite.  Moderate conscious sedation was administered throughout the procedure during a face to face encounter with the patient with my supervision of the RN administering medicines and monitoring the patient's vital signs, pulse oximetry, telemetry and mental status throughout from the start of the procedure until the patient was taken to the recovery room. The right neck chest and shoulder were sterilely prepped and draped, and a sterile surgical field was created. Ultrasound was used to help visualize a patent right internal jugular vein. This was then accessed under direct ultrasound guidance without difficulty with the Seldinger needle and a permanent image was recorded. A J-wire was placed. After skin nick and dilatation, the peel-away sheath was then placed over the wire. I then anesthetized an area under the clavicle approximately 1-2 fingerbreadths. A transverse incision was created and an inferior  pocket was created with electrocautery and blunt dissection. The port was then brought onto the field, placed into the pocket and secured to the chest wall with 2 Prolene sutures. The catheter was connected to the port and tunneled from the subclavicular incision to the access site. Fluoroscopic guidance was then used to cut the catheter to an appropriate length. The catheter was then placed through the peel-away sheath and the peel-away sheath was removed. The catheter tip was parked in excellent location under fluorocoscopic guidance in the SVC just above the right atrium. The pocket was then irrigated with antibiotic impregnated saline and the wound was closed with a running 3-0 Vicryl and a 4-0 Monocryl. The access incision was closed with a single 4-0 Monocryl. The Huber needle was used to withdraw blood and flush the port with heparinized saline. Dermabond was then placed as a dressing. The patient tolerated the procedure well and was taken to the recovery room in stable condition.   Leotis Pain 08/29/2016 11:22 AM   This note was created with Dragon Medical transcription system. Any errors in dictation are purely unintentional.

## 2016-08-30 ENCOUNTER — Encounter: Payer: Self-pay | Admitting: Vascular Surgery

## 2016-08-30 NOTE — Patient Instructions (Signed)
Gemcitabine injection What is this medicine? GEMCITABINE (jem SIT a been) is a chemotherapy drug. This medicine is used to treat many types of cancer like breast cancer, lung cancer, pancreatic cancer, and ovarian cancer. This medicine may be used for other purposes; ask your health care provider or pharmacist if you have questions. COMMON BRAND NAME(S): Gemzar What should I tell my health care provider before I take this medicine? They need to know if you have any of these conditions: -blood disorders -infection -kidney disease -liver disease -recent or ongoing radiation therapy -an unusual or allergic reaction to gemcitabine, other chemotherapy, other medicines, foods, dyes, or preservatives -pregnant or trying to get pregnant -breast-feeding How should I use this medicine? This drug is given as an infusion into a vein. It is administered in a hospital or clinic by a specially trained health care professional. Talk to your pediatrician regarding the use of this medicine in children. Special care may be needed. Overdosage: If you think you have taken too much of this medicine contact a poison control center or emergency room at once. NOTE: This medicine is only for you. Do not share this medicine with others. What if I miss a dose? It is important not to miss your dose. Call your doctor or health care professional if you are unable to keep an appointment. What may interact with this medicine? -medicines to increase blood counts like filgrastim, pegfilgrastim, sargramostim -some other chemotherapy drugs like cisplatin -vaccines Talk to your doctor or health care professional before taking any of these medicines: -acetaminophen -aspirin -ibuprofen -ketoprofen -naproxen This list may not describe all possible interactions. Give your health care provider a list of all the medicines, herbs, non-prescription drugs, or dietary supplements you use. Also tell them if you smoke, drink alcohol,  or use illegal drugs. Some items may interact with your medicine. What should I watch for while using this medicine? Visit your doctor for checks on your progress. This drug may make you feel generally unwell. This is not uncommon, as chemotherapy can affect healthy cells as well as cancer cells. Report any side effects. Continue your course of treatment even though you feel ill unless your doctor tells you to stop. In some cases, you may be given additional medicines to help with side effects. Follow all directions for their use. Call your doctor or health care professional for advice if you get a fever, chills or sore throat, or other symptoms of a cold or flu. Do not treat yourself. This drug decreases your body's ability to fight infections. Try to avoid being around people who are sick. This medicine may increase your risk to bruise or bleed. Call your doctor or health care professional if you notice any unusual bleeding. Be careful brushing and flossing your teeth or using a toothpick because you may get an infection or bleed more easily. If you have any dental work done, tell your dentist you are receiving this medicine. Avoid taking products that contain aspirin, acetaminophen, ibuprofen, naproxen, or ketoprofen unless instructed by your doctor. These medicines may hide a fever. Women should inform their doctor if they wish to become pregnant or think they might be pregnant. There is a potential for serious side effects to an unborn child. Talk to your health care professional or pharmacist for more information. Do not breast-feed an infant while taking this medicine. What side effects may I notice from receiving this medicine? Side effects that you should report to your doctor or health care professional as   soon as possible: -allergic reactions like skin rash, itching or hives, swelling of the face, lips, or tongue -low blood counts - this medicine may decrease the number of white blood cells,  red blood cells and platelets. You may be at increased risk for infections and bleeding. -signs of infection - fever or chills, cough, sore throat, pain or difficulty passing urine -signs of decreased platelets or bleeding - bruising, pinpoint red spots on the skin, black, tarry stools, blood in the urine -signs of decreased red blood cells - unusually weak or tired, fainting spells, lightheadedness -breathing problems -chest pain -mouth sores -nausea and vomiting -pain, swelling, redness at site where injected -pain, tingling, numbness in the hands or feet -stomach pain -swelling of ankles, feet, hands -unusual bleeding Side effects that usually do not require medical attention (report to your doctor or health care professional if they continue or are bothersome): -constipation -diarrhea -hair loss -loss of appetite -stomach upset This list may not describe all possible side effects. Call your doctor for medical advice about side effects. You may report side effects to FDA at 1-800-FDA-1088. Where should I keep my medicine? This drug is given in a hospital or clinic and will not be stored at home. NOTE: This sheet is a summary. It may not cover all possible information. If you have questions about this medicine, talk to your doctor, pharmacist, or health care provider.  2017 Elsevier/Gold Standard (2007-11-06 18:45:54) Nanoparticle Albumin-Bound Paclitaxel injection What is this medicine? NANOPARTICLE ALBUMIN-BOUND PACLITAXEL (Na no PAHR ti kuhl al BYOO muhn-bound PAK li TAX el) is a chemotherapy drug. It targets fast dividing cells, like cancer cells, and causes these cells to die. This medicine is used to treat advanced breast cancer and advanced lung cancer. This medicine may be used for other purposes; ask your health care provider or pharmacist if you have questions. COMMON BRAND NAME(S): Abraxane What should I tell my health care provider before I take this medicine? They need  to know if you have any of these conditions: -kidney disease -liver disease -low blood counts, like low platelets, red blood cells, or white blood cells -recent or ongoing radiation therapy -an unusual or allergic reaction to paclitaxel, albumin, other chemotherapy, other medicines, foods, dyes, or preservatives -pregnant or trying to get pregnant -breast-feeding How should I use this medicine? This drug is given as an infusion into a vein. It is administered in a hospital or clinic by a specially trained health care professional. Talk to your pediatrician regarding the use of this medicine in children. Special care may be needed. Overdosage: If you think you have taken too much of this medicine contact a poison control center or emergency room at once. NOTE: This medicine is only for you. Do not share this medicine with others. What if I miss a dose? It is important not to miss your dose. Call your doctor or health care professional if you are unable to keep an appointment. What may interact with this medicine? -cyclosporine -diazepam -ketoconazole -medicines to increase blood counts like filgrastim, pegfilgrastim, sargramostim -other chemotherapy drugs like cisplatin, doxorubicin, epirubicin, etoposide, teniposide, vincristine -quinidine -testosterone -vaccines -verapamil Talk to your doctor or health care professional before taking any of these medicines: -acetaminophen -aspirin -ibuprofen -ketoprofen -naproxen This list may not describe all possible interactions. Give your health care provider a list of all the medicines, herbs, non-prescription drugs, or dietary supplements you use. Also tell them if you smoke, drink alcohol, or use illegal drugs. Some items may  interact with your medicine. What should I watch for while using this medicine? Your condition will be monitored carefully while you are receiving this medicine. You will need important blood work done while you are  taking this medicine. This medicine can cause serious allergic reactions. If you experience allergic reactions like skin rash, itching or hives, swelling of the face, lips, or tongue, tell your doctor or health care professional right away. In some cases, you may be given additional medicines to help with side effects. Follow all directions for their use. This drug may make you feel generally unwell. This is not uncommon, as chemotherapy can affect healthy cells as well as cancer cells. Report any side effects. Continue your course of treatment even though you feel ill unless your doctor tells you to stop. Call your doctor or health care professional for advice if you get a fever, chills or sore throat, or other symptoms of a cold or flu. Do not treat yourself. This drug decreases your body's ability to fight infections. Try to avoid being around people who are sick. This medicine may increase your risk to bruise or bleed. Call your doctor or health care professional if you notice any unusual bleeding. Be careful brushing and flossing your teeth or using a toothpick because you may get an infection or bleed more easily. If you have any dental work done, tell your dentist you are receiving this medicine. Avoid taking products that contain aspirin, acetaminophen, ibuprofen, naproxen, or ketoprofen unless instructed by your doctor. These medicines may hide a fever. Do not become pregnant while taking this medicine. Women should inform their doctor if they wish to become pregnant or think they might be pregnant. There is a potential for serious side effects to an unborn child. Talk to your health care professional or pharmacist for more information. Do not breast-feed an infant while taking this medicine. Men are advised not to father a child while receiving this medicine. What side effects may I notice from receiving this medicine? Side effects that you should report to your doctor or health care  professional as soon as possible: -allergic reactions like skin rash, itching or hives, swelling of the face, lips, or tongue -low blood counts - This drug may decrease the number of white blood cells, red blood cells and platelets. You may be at increased risk for infections and bleeding. -signs of infection - fever or chills, cough, sore throat, pain or difficulty passing urine -signs of decreased platelets or bleeding - bruising, pinpoint red spots on the skin, black, tarry stools, nosebleeds -signs of decreased red blood cells - unusually weak or tired, fainting spells, lightheadedness -breathing problems -changes in vision -chest pain -high or low blood pressure -mouth sores -nausea and vomiting -pain, swelling, redness or irritation at the injection site -pain, tingling, numbness in the hands or feet -slow or irregular heartbeat -swelling of the ankle, feet, hands Side effects that usually do not require medical attention (report to your doctor or health care professional if they continue or are bothersome): -aches, pains -changes in the color of fingernails -diarrhea -hair loss -loss of appetite This list may not describe all possible side effects. Call your doctor for medical advice about side effects. You may report side effects to FDA at 1-800-FDA-1088. Where should I keep my medicine? This drug is given in a hospital or clinic and will not be stored at home. NOTE: This sheet is a summary. It may not cover all possible information. If you have  questions about this medicine, talk to your doctor, pharmacist, or health care provider.  2017 Elsevier/Gold Standard (2015-04-29 10:05:20)

## 2016-09-01 ENCOUNTER — Inpatient Hospital Stay: Payer: PPO

## 2016-09-01 ENCOUNTER — Telehealth: Payer: Self-pay | Admitting: *Deleted

## 2016-09-01 ENCOUNTER — Other Ambulatory Visit: Payer: Self-pay | Admitting: *Deleted

## 2016-09-01 DIAGNOSIS — C259 Malignant neoplasm of pancreas, unspecified: Secondary | ICD-10-CM

## 2016-09-01 MED ORDER — LIDOCAINE-PRILOCAINE 2.5-2.5 % EX CREA: 1.0000 "application " | TOPICAL_CREAM | CUTANEOUS | 0 refills | Status: AC | PRN

## 2016-09-01 NOTE — Telephone Encounter (Signed)
Called in eliquis to [employer], their internet has been messed up and we did not know it did not go through.  Dr. Mike Gip will give him nausea meds tomorrow, she does not want him to have them pre chemo  Thanks,  Lianette Broussard

## 2016-09-01 NOTE — Telephone Encounter (Signed)
Asking about rx for EMLA and nausea med , also inquiring about a blood thinner discussed at last visit

## 2016-09-01 NOTE — Telephone Encounter (Signed)
Notified Paige of EMLA and Eliquis being called to pharmacy, Nausea med will be given tomorrow at appt

## 2016-09-02 ENCOUNTER — Other Ambulatory Visit: Payer: Self-pay | Admitting: Hematology and Oncology

## 2016-09-02 ENCOUNTER — Inpatient Hospital Stay: Payer: PPO

## 2016-09-02 ENCOUNTER — Other Ambulatory Visit: Payer: Self-pay | Admitting: *Deleted

## 2016-09-02 ENCOUNTER — Inpatient Hospital Stay (HOSPITAL_BASED_OUTPATIENT_CLINIC_OR_DEPARTMENT_OTHER): Payer: PPO | Admitting: Hematology and Oncology

## 2016-09-02 ENCOUNTER — Encounter: Payer: Self-pay | Admitting: Hematology and Oncology

## 2016-09-02 ENCOUNTER — Inpatient Hospital Stay
Admission: AC | Admit: 2016-09-02 | Discharge: 2016-09-03 | DRG: 638 | Disposition: A | Discharge status: Home / Self Care | Payer: PPO | Source: Ambulatory Visit | Attending: Internal Medicine | Admitting: Internal Medicine

## 2016-09-02 VITALS — BP 89/60 | HR 126 | Temp 97.1°F | Resp 18 | Wt 127.0 lb

## 2016-09-02 DIAGNOSIS — I81 Portal vein thrombosis: Secondary | ICD-10-CM | POA: Diagnosis not present

## 2016-09-02 DIAGNOSIS — F17200 Nicotine dependence, unspecified, uncomplicated: Secondary | ICD-10-CM | POA: Diagnosis present

## 2016-09-02 DIAGNOSIS — N182 Chronic kidney disease, stage 2 (mild): Secondary | ICD-10-CM | POA: Diagnosis present

## 2016-09-02 DIAGNOSIS — D649 Anemia, unspecified: Secondary | ICD-10-CM | POA: Diagnosis not present

## 2016-09-02 DIAGNOSIS — R739 Hyperglycemia, unspecified: Secondary | ICD-10-CM

## 2016-09-02 DIAGNOSIS — Z79899 Other long term (current) drug therapy: Secondary | ICD-10-CM | POA: Diagnosis not present

## 2016-09-02 DIAGNOSIS — N179 Acute kidney failure, unspecified: Secondary | ICD-10-CM | POA: Diagnosis not present

## 2016-09-02 DIAGNOSIS — E1165 Type 2 diabetes mellitus with hyperglycemia: Principal | ICD-10-CM | POA: Diagnosis present

## 2016-09-02 DIAGNOSIS — E785 Hyperlipidemia, unspecified: Secondary | ICD-10-CM | POA: Diagnosis present

## 2016-09-02 DIAGNOSIS — Z791 Long term (current) use of non-steroidal anti-inflammatories (NSAID): Secondary | ICD-10-CM | POA: Diagnosis not present

## 2016-09-02 DIAGNOSIS — Z7984 Long term (current) use of oral hypoglycemic drugs: Secondary | ICD-10-CM | POA: Diagnosis not present

## 2016-09-02 DIAGNOSIS — F17210 Nicotine dependence, cigarettes, uncomplicated: Secondary | ICD-10-CM | POA: Diagnosis not present

## 2016-09-02 DIAGNOSIS — E1122 Type 2 diabetes mellitus with diabetic chronic kidney disease: Secondary | ICD-10-CM | POA: Diagnosis not present

## 2016-09-02 DIAGNOSIS — Z888 Allergy status to other drugs, medicaments and biological substances status: Secondary | ICD-10-CM | POA: Diagnosis not present

## 2016-09-02 DIAGNOSIS — Z7982 Long term (current) use of aspirin: Secondary | ICD-10-CM | POA: Diagnosis not present

## 2016-09-02 DIAGNOSIS — E279 Disorder of adrenal gland, unspecified: Secondary | ICD-10-CM | POA: Diagnosis not present

## 2016-09-02 DIAGNOSIS — E871 Hypo-osmolality and hyponatremia: Secondary | ICD-10-CM

## 2016-09-02 DIAGNOSIS — Z66 Do not resuscitate: Secondary | ICD-10-CM | POA: Diagnosis present

## 2016-09-02 DIAGNOSIS — C259 Malignant neoplasm of pancreas, unspecified: Secondary | ICD-10-CM

## 2016-09-02 DIAGNOSIS — E119 Type 2 diabetes mellitus without complications: Secondary | ICD-10-CM | POA: Diagnosis not present

## 2016-09-02 DIAGNOSIS — I129 Hypertensive chronic kidney disease with stage 1 through stage 4 chronic kidney disease, or unspecified chronic kidney disease: Secondary | ICD-10-CM | POA: Diagnosis not present

## 2016-09-02 DIAGNOSIS — C787 Secondary malignant neoplasm of liver and intrahepatic bile duct: Secondary | ICD-10-CM

## 2016-09-02 DIAGNOSIS — N289 Disorder of kidney and ureter, unspecified: Secondary | ICD-10-CM | POA: Diagnosis not present

## 2016-09-02 DIAGNOSIS — Z7901 Long term (current) use of anticoagulants: Secondary | ICD-10-CM | POA: Diagnosis not present

## 2016-09-02 DIAGNOSIS — E7251 Non-ketotic hyperglycinemia: Secondary | ICD-10-CM | POA: Diagnosis not present

## 2016-09-02 DIAGNOSIS — R17 Unspecified jaundice: Secondary | ICD-10-CM

## 2016-09-02 DIAGNOSIS — I10 Essential (primary) hypertension: Secondary | ICD-10-CM | POA: Diagnosis not present

## 2016-09-02 DIAGNOSIS — N184 Chronic kidney disease, stage 4 (severe): Secondary | ICD-10-CM

## 2016-09-02 DIAGNOSIS — K869 Disease of pancreas, unspecified: Secondary | ICD-10-CM | POA: Diagnosis not present

## 2016-09-02 HISTORY — DX: Malignant neoplasm of pancreas, unspecified: C25.9

## 2016-09-02 LAB — GLUCOSE, CAPILLARY
GLUCOSE-CAPILLARY: 102 mg/dL — AB (ref 65–99)
GLUCOSE-CAPILLARY: 161 mg/dL — AB (ref 65–99)
GLUCOSE-CAPILLARY: 215 mg/dL — AB (ref 65–99)
GLUCOSE-CAPILLARY: 524 mg/dL — AB (ref 65–99)
GLUCOSE-CAPILLARY: 563 mg/dL — AB (ref 65–99)
GLUCOSE-CAPILLARY: 95 mg/dL (ref 65–99)
Glucose-Capillary: 527 mg/dL (ref 65–99)
Glucose-Capillary: 328 mg/dL — ABNORMAL HIGH (ref 65–99)
Glucose-Capillary: 282 mg/dL — ABNORMAL HIGH (ref 65–99)
Glucose-Capillary: 251 mg/dL — ABNORMAL HIGH (ref 65–99)
Glucose-Capillary: 220 mg/dL — ABNORMAL HIGH (ref 65–99)

## 2016-09-02 LAB — CBC WITH DIFFERENTIAL/PLATELET
Basophils Absolute: 0 10*3/uL (ref 0–0.1)
Basophils Relative: 0 %
Eosinophils Absolute: 0.2 10*3/uL (ref 0–0.7)
Eosinophils Relative: 1 %
HCT: 32.8 % — ABNORMAL LOW (ref 40.0–52.0)
Hemoglobin: 10.8 g/dL — ABNORMAL LOW (ref 13.0–18.0)
Lymphocytes Relative: 6 %
Lymphs Abs: 1.2 10*3/uL (ref 1.0–3.6)
MCH: 30.4 pg (ref 26.0–34.0)
MCHC: 32.9 g/dL (ref 32.0–36.0)
MCV: 92.6 fL (ref 80.0–100.0)
Monocytes Absolute: 2.7 10*3/uL — ABNORMAL HIGH (ref 0.2–1.0)
Monocytes Relative: 13 %
Neutro Abs: 16.7 10*3/uL — ABNORMAL HIGH (ref 1.4–6.5)
Neutrophils Relative %: 80 %
Platelets: 205 10*3/uL (ref 150–440)
RBC: 3.55 MIL/uL — ABNORMAL LOW (ref 4.40–5.90)
RDW: 14.4 % (ref 11.5–14.5)
WBC: 20.8 10*3/uL — ABNORMAL HIGH (ref 3.8–10.6)

## 2016-09-02 LAB — COMPREHENSIVE METABOLIC PANEL
ALT: 59 U/L (ref 17–63)
AST: 95 U/L — ABNORMAL HIGH (ref 15–41)
Albumin: 2.8 g/dL — ABNORMAL LOW (ref 3.5–5.0)
Alkaline Phosphatase: 559 U/L — ABNORMAL HIGH (ref 38–126)
Anion gap: 13 (ref 5–15)
BUN: 60 mg/dL — ABNORMAL HIGH (ref 6–20)
CO2: 20 mmol/L — ABNORMAL LOW (ref 22–32)
Calcium: 8.7 mg/dL — ABNORMAL LOW (ref 8.9–10.3)
Chloride: 96 mmol/L — ABNORMAL LOW (ref 101–111)
Creatinine, Ser: 2.45 mg/dL — ABNORMAL HIGH (ref 0.61–1.24)
GFR calc Af Amer: 27 mL/min — ABNORMAL LOW (ref >60)
GFR calc non Af Amer: 23 mL/min — ABNORMAL LOW (ref >60)
Glucose, Bld: 538 mg/dL (ref 65–99)
Potassium: 4.3 mmol/L (ref 3.5–5.1)
Sodium: 129 mmol/L — ABNORMAL LOW (ref 135–145)
Total Bilirubin: 2.5 mg/dL — ABNORMAL HIGH (ref 0.3–1.2)
Total Protein: 7.2 g/dL (ref 6.5–8.1)

## 2016-09-02 LAB — MRSA PCR SCREENING: MRSA BY PCR: NEGATIVE

## 2016-09-02 LAB — MAGNESIUM: Magnesium: 2.2 mg/dL (ref 1.7–2.4)

## 2016-09-02 MED ORDER — DEXTROSE-NACL 5-0.45 % IV SOLN
INTRAVENOUS | Status: DC
  Administered 2016-09-02: 75 mL/h via INTRAVENOUS

## 2016-09-02 MED ORDER — HYDRALAZINE HCL 20 MG/ML IJ SOLN: 10.0000 mg | Freq: Four times a day (QID) | INTRAMUSCULAR | Status: DC | PRN

## 2016-09-02 MED ORDER — ACETAMINOPHEN 650 MG RE SUPP: 650.0000 mg | Freq: Four times a day (QID) | RECTAL | Status: DC | PRN

## 2016-09-02 MED ORDER — INSULIN REGULAR BOLUS VIA INFUSION
0.0000 [IU] | Freq: Three times a day (TID) | INTRAVENOUS | Status: DC
  Filled 2016-09-02: qty 10

## 2016-09-02 MED ORDER — SODIUM CHLORIDE 0.9 % IV SOLN: INTRAVENOUS | Status: DC

## 2016-09-02 MED ORDER — ONDANSETRON HCL 4 MG/2ML IJ SOLN
4.0000 mg | Freq: Four times a day (QID) | INTRAMUSCULAR | Status: DC | PRN
  Administered 2016-09-02: 18:00:00 4 mg via INTRAVENOUS
  Filled 2016-09-02: qty 2

## 2016-09-02 MED ORDER — DEXTROSE 50 % IV SOLN: 25.0000 mL | INTRAVENOUS | Status: DC | PRN

## 2016-09-02 MED ORDER — ASPIRIN EC 81 MG PO TBEC
81.0000 mg | DELAYED_RELEASE_TABLET | Freq: Every day | ORAL | Status: DC
  Administered 2016-09-02: 81 mg via ORAL
  Filled 2016-09-02: qty 1

## 2016-09-02 MED ORDER — INSULIN ASPART 100 UNIT/ML ~~LOC~~ SOLN: 0.0000 [IU] | Freq: Every day | SUBCUTANEOUS | Status: DC

## 2016-09-02 MED ORDER — SODIUM CHLORIDE 0.9 % IV SOLN
INTRAVENOUS | Status: DC
  Administered 2016-09-02: 18:00:00 via INTRAVENOUS
  Filled 2016-09-02: qty 2.5

## 2016-09-02 MED ORDER — VITAMIN D 1000 UNITS PO TABS
1000.0000 [IU] | ORAL_TABLET | Freq: Every day | ORAL | Status: DC
  Administered 2016-09-03: 1000 [IU] via ORAL
  Filled 2016-09-02: qty 1

## 2016-09-02 MED ORDER — ONDANSETRON HCL 4 MG PO TABS: 4.0000 mg | ORAL_TABLET | Freq: Four times a day (QID) | ORAL | Status: DC | PRN

## 2016-09-02 MED ORDER — ENOXAPARIN SODIUM 40 MG/0.4ML ~~LOC~~ SOLN: 40.0000 mg | SUBCUTANEOUS | Status: DC

## 2016-09-02 MED ORDER — INSULIN ASPART 100 UNIT/ML ~~LOC~~ SOLN
5.0000 [IU] | Freq: Once | SUBCUTANEOUS | Status: AC
  Administered 2016-09-02: 5 [IU] via SUBCUTANEOUS
  Filled 2016-09-02: qty 5

## 2016-09-02 MED ORDER — ADULT MULTIVITAMIN W/MINERALS CH
1.0000 | ORAL_TABLET | Freq: Every day | ORAL | Status: DC
  Administered 2016-09-02 – 2016-09-03 (×2): 1 via ORAL
  Filled 2016-09-02 (×2): qty 1

## 2016-09-02 MED ORDER — INSULIN ASPART 100 UNIT/ML ~~LOC~~ SOLN
0.0000 [IU] | Freq: Three times a day (TID) | SUBCUTANEOUS | Status: DC
  Administered 2016-09-03: 3 [IU] via SUBCUTANEOUS
  Administered 2016-09-03: 5 [IU] via SUBCUTANEOUS
  Filled 2016-09-02: qty 5
  Filled 2016-09-02: qty 3

## 2016-09-02 MED ORDER — APIXABAN 2.5 MG PO TABS
2.5000 mg | ORAL_TABLET | Freq: Two times a day (BID) | ORAL | Status: DC
  Administered 2016-09-02 – 2016-09-03 (×2): 2.5 mg via ORAL
  Filled 2016-09-02 (×2): qty 1

## 2016-09-02 MED ORDER — INSULIN ASPART 100 UNIT/ML ~~LOC~~ SOLN
20.0000 [IU] | Freq: Once | SUBCUTANEOUS | Status: AC
  Administered 2016-09-02: 20 [IU] via SUBCUTANEOUS
  Filled 2016-09-02: qty 20

## 2016-09-02 MED ORDER — PRAVASTATIN SODIUM 40 MG PO TABS: 40.0000 mg | ORAL_TABLET | Freq: Every evening | ORAL | Status: DC

## 2016-09-02 MED ORDER — ALUM & MAG HYDROXIDE-SIMETH 200-200-20 MG/5ML PO SUSP
30.0000 mL | ORAL | Status: DC | PRN
  Administered 2016-09-02: 30 mL via ORAL
  Filled 2016-09-02: qty 30

## 2016-09-02 MED ORDER — VITAMIN C 500 MG PO TABS
500.0000 mg | ORAL_TABLET | Freq: Every day | ORAL | Status: DC
  Administered 2016-09-02 – 2016-09-03 (×2): 500 mg via ORAL
  Filled 2016-09-02 (×2): qty 1

## 2016-09-02 MED ORDER — AMLODIPINE BESYLATE 5 MG PO TABS
5.0000 mg | ORAL_TABLET | Freq: Every day | ORAL | Status: DC
  Administered 2016-09-03: 5 mg via ORAL
  Filled 2016-09-02: qty 1

## 2016-09-02 MED ORDER — SODIUM CHLORIDE 0.9 % IV SOLN
INTRAVENOUS | Status: DC
  Administered 2016-09-02 (×2): via INTRAVENOUS

## 2016-09-02 MED ORDER — ACETAMINOPHEN 325 MG PO TABS: 650.0000 mg | ORAL_TABLET | Freq: Four times a day (QID) | ORAL | Status: DC | PRN

## 2016-09-02 MED ORDER — HYDROCODONE-ACETAMINOPHEN 5-325 MG PO TABS: 1.0000 | ORAL_TABLET | ORAL | Status: DC | PRN

## 2016-09-02 NOTE — Care Management (Signed)
Admitted to [facility] with the diagnosis of elevated blood sugars. Lives with wife, Arbie Cookey, [phone]). Last seen Dr. Gilford Rile 07/22/16. No home health. No skilled facility. No home oxygen. Prescriptions are filled at [facility]. Rolling walker and cane in the home, if needed. Last fall was a month ago. Decreased appetite since October 2017. Lost 25 pounds. Takes care of all basic activities of daily living himself, drives. Family will transport. Port was placed on Monday. Suppose to have started Chemotherapy today at the [facility], ( pancreatic cancer) but bloods sugars were elevated.  Shelbie Ammons RN MSN CCM Care Management

## 2016-09-02 NOTE — H&P (Signed)
[facility] [place] NAME: Ethan Henry    MR#:  [id number]  DATE OF BIRTH:  [birth year]  DATE OF ADMISSION:  09/02/2016  PRIMARY CARE PHYSICIAN: Madelyn Brunner, MD   REQUESTING/REFERRING PHYSICIAN: Dr. Mike Gip  CHIEF COMPLAINT:   Sent from Dr. Kem Parkinson office due to abnormal labs HISTORY OF PRESENT ILLNESS:  Ethan Henry  is a 83 y.o. male with a known history of Pancreatic cancer with  liver metastases who presented from Dr. Kem Parkinson office due to abnormal labs. The patient was found to have increasing bilirubin as well as increased creatinine and hyponatremia associated with severe hyperglycemia.  PAST MEDICAL HISTORY:   Past Medical History:  Diagnosis Date  . Diabetes mellitus without complication ([place])   . Hyperlipidemia   . Hypertension   . Renal insufficiency    stage 2 renal dz.     PAST SURGICAL HISTORY:   Past Surgical History:  Procedure Laterality Date  . Partial shoulder arthroscopy    . PORTA CATH INSERTION N/A 08/29/2016   Procedure: Glori Luis Cath Insertion;  Surgeon: Algernon Huxley, MD;  Location: [place] [facility];  Service: Cardiovascular;  Laterality: N/A;    SOCIAL HISTORY:   Social History  Substance Use Topics  . Smoking status: Current Some Day Smoker    Packs/day: 1.50  . Smokeless tobacco: Never Used     Comment: Smokes only on weekends  . Alcohol use Yes     Comment: occasional    FAMILY HISTORY:  No family history on file.  DRUG ALLERGIES:   Allergies  Allergen Reactions  . Glipizide Rash    REVIEW OF SYSTEMS:   Review of Systems  Constitutional: Positive for malaise/fatigue and weight loss. Negative for chills and fever.  HENT: Negative.  Negative for ear discharge, ear pain, hearing loss, nosebleeds and sore throat.   Eyes: Negative.  Negative for blurred vision and pain.  Respiratory: Negative.  Negative for cough, hemoptysis, shortness of breath and wheezing.    Cardiovascular: Negative.  Negative for chest pain, palpitations and leg swelling.  Gastrointestinal: Negative.  Negative for abdominal pain, blood in stool, diarrhea, nausea and vomiting.  Genitourinary: Negative.  Negative for dysuria.  Musculoskeletal: Negative.  Negative for back pain.  Skin: Negative.   Neurological: Positive for weakness. Negative for dizziness, tremors, speech change, focal weakness, seizures and headaches.  Endo/Heme/Allergies: Negative.  Does not bruise/bleed easily.  Psychiatric/Behavioral: Negative.  Negative for depression, hallucinations and suicidal ideas.    MEDICATIONS AT HOME:   Prior to Admission medications   Medication Sig Start Date End Date Taking? Authorizing Provider  amLODipine (NORVASC) 5 MG tablet Take 5 mg by mouth daily.  05/02/16   Historical Provider, MD  apixaban (ELIQUIS) 2.5 MG TABS tablet Take 1 tablet (2.5 mg total) by mouth 2 (two) times daily. 08/27/16   Lequita Asal, MD  aspirin EC 81 MG tablet Take 81 mg by mouth at bedtime.     Historical Provider, MD  Blood Glucose Calibration (OT ULTRA/FASTTK CNTRL SOLN) SOLN  01/26/16   Historical Provider, MD  Cholecalciferol (VITAMIN D-1000 MAX ST) 1000 units tablet Take 1,000 Units by mouth daily.     Historical Provider, MD  glucose blood (ONE TOUCH ULTRA TEST) test strip  01/26/16   Historical Provider, MD  Lancets (ONETOUCH ULTRASOFT) lancets  01/26/16   Historical Provider, MD  lidocaine-prilocaine (EMLA) cream Apply 1 application topically as needed. 09/01/16   Lequita Asal,  MD  losartan-hydrochlorothiazide (HYZAAR) 100-25 MG tablet Take 1 tablet by mouth daily.    Historical Provider, MD  meloxicam (MOBIC) 7.5 MG tablet Take 7.5 mg by mouth daily.    Historical Provider, MD  Multiple Vitamin (MULTIVITAMIN WITH MINERALS) TABS tablet Take 1 tablet by mouth daily at 12 noon.    Historical Provider, MD  Omega-3 Fatty Acids (FISH OIL PO) Take 1 capsule by mouth daily.     Historical  Provider, MD  pioglitazone (ACTOS) 45 MG tablet Take 45 mg by mouth daily.    Historical Provider, MD  pravastatin (PRAVACHOL) 40 MG tablet Take 40 mg by mouth every evening.    Historical Provider, MD  vitamin C (ASCORBIC ACID) 500 MG tablet Take 500 mg by mouth daily.     Historical Provider, MD      VITAL SIGNS:  There were no vitals taken for this visit.  PHYSICAL EXAMINATION:   Physical Exam  Constitutional: He is oriented to person, place, and time and well-developed, well-nourished, and in no distress. No distress.  HENT:  Head: Normocephalic.  Eyes: No scleral icterus.  Neck: Normal range of motion. Neck supple. No JVD present. No tracheal deviation present.  Cardiovascular: Normal rate, regular rhythm and normal heart sounds.  Exam reveals no gallop and no friction rub.   No murmur heard. Pulmonary/Chest: Effort normal and breath sounds normal. No respiratory distress. He has no wheezes. He has no rales. He exhibits no tenderness.  Abdominal: Soft. Bowel sounds are normal. He exhibits no distension and no mass. There is no tenderness. There is no rebound and no guarding.  Musculoskeletal: Normal range of motion. He exhibits no edema.  Neurological: He is alert and oriented to person, place, and time.  Skin: Skin is warm. No rash noted. No erythema.  Psychiatric: Affect and judgment normal.      LABORATORY PANEL:   CBC  Recent Labs Lab 09/02/16 0829  WBC 20.8*  HGB 10.8*  HCT 32.8*  PLT 205   ------------------------------------------------------------------------------------------------------------------  Chemistries   Recent Labs Lab 09/02/16 0829  NA 129*  K 4.3  CL 96*  CO2 20*  GLUCOSE 538*  BUN 60*  CREATININE 2.45*  CALCIUM 8.7*  MG 2.2  AST 95*  ALT 59  ALKPHOS 559*  BILITOT 2.5*   ------------------------------------------------------------------------------------------------------------------  Cardiac Enzymes No results for input(s):  TROPONINI in the last 168 hours. ------------------------------------------------------------------------------------------------------------------  RADIOLOGY:  No results found.  EKG:  No orders found for this or any previous visit.  IMPRESSION AND PLAN:   83 year old male with metastatic pancreatic cancer and diabetes who was seen at the oncologist office and found to have electrolyte abnormalities including hyperglycemia.  1. Hyperglycemia: Initiate normal saline Diabetes coordinator consultation Monitor Accu-Cheks Consider insulin drip if blood sugars not responding to normal saline  2. Hyponatremia in the setting of hyperglycemia  3. Acute kidney injury on chronic kidney disease stage II: Consider nephrology consultation if creatinine shows no improvement after IV fluids Hold Nephrotoxic agents.  4. Elevated bilirubin: Right upper quad ultrasound  5. Metastatic pancreatic cancer: Oncology consultation Chemotherapy postponed for today   6. Essential HTN: hold Hyzaar due to #3. Monitor. Add PRN hydralazine.  All the records are reviewed... Management plans discussed with the patient and he is in agreement  CODE STATUS: DNR  TOTAL TIME TAKING CARE OF THIS PATIENT: 45 minutes.    Noel Rodier M.D on 09/02/2016 at 10:37 AM  Between 7am to 6pm - Pager - [phone]  After  6pm go to [email or web address] - password [email or web address]  Office  [phone]  CC: Primary care physician; Madelyn Brunner, MD

## 2016-09-02 NOTE — Progress Notes (Signed)
Patient just arrived to ICU around 1845.  Patient settled.  A&Ox4 with no distress noted.  Blood glucose 251 and insulin drip adjusted per glucose stabilizer.  Report given to Tye Maryland, RN night shift nurse.

## 2016-09-02 NOTE — Progress Notes (Signed)
Patient's blood sugasrs still very elevated D/w Almyra Free from DM tx to stepdown on insulin gtt

## 2016-09-02 NOTE — Progress Notes (Signed)
Pt transferred  To stepdown at this time via  Bed. A/o. No distress.  Report  Given to ccu nurse.

## 2016-09-02 NOTE — Progress Notes (Signed)
Inpatient Diabetes Program Recommendations  AACE/ADA: New Consensus Statement on Inpatient Glycemic Control (2015)  Target Ranges:  Prepandial:   less than 140 mg/dL      Peak postprandial:   less than 180 mg/dL (1-2 hours)      Critically ill patients:  140 - 180 mg/dL   Lab Results  Component Value Date   [id number] ([name]) 09/02/2016    Review of Glycemic Control  Results for [name], [name] (MRN [id number]) as of 09/02/2016 14:14  Ref. Range 09/02/2016 11:19 09/02/2016 11:21 09/02/2016 13:50  Glucose-Capillary Latest Ref Range: 65 - 99 mg/dL 563 (HH) 524 (HH) 527 (HH)    Diabetes history: Type 2 Outpatient Diabetes medications: Actos 45mg /day Current orders for Inpatient glycemic control: Novolog 0-15 units tid, Novolog 0-5 units qhs  Inpatient Diabetes Program Recommendations:  Patient was given 20 units of Novolog insulin at 12:49pm for a blood sugar of 524mg /dl.  Repeat blood sugar at 1350 was 527mg /dl.    Please recheck blood sugar at 15:49pm.  If the blood sugar is not below 400mg /dl at that time, consider placing the patient in the unit and start IV insulin/Glucostabilizer order set to determine patient's insulin needs.  Do not give insulin closer than q4h since patient has pancreatic cancer and poor renal function.  If the blood sugar is improved by next CBG check, consider changing Novolog to q4h since the patient is NPO  Paged MD to discuss recommendations.   Ethan Fitz, Ethan Henry, Ethan Henry, Ethan Henry, Ethan Henry Diabetes Coordinator Inpatient Diabetes Program  [phone] (Team Pager) [phone] ([place]) 09/02/2016 2:25 PM

## 2016-09-02 NOTE — Progress Notes (Signed)
[place] [facility] day:  09/02/2016  Chief Complaint: Ethan Henry is a 83 y.o. male with a pancreatic mass and liver metastasis who is seen for assessment prior to cycle #1 gemcitabine and +/- Abraxane.  HPI:   The patient was last seen in the medical oncology clinic on 08/26/2016.  At that time, pathology was reviewed.  We discussed palliative management of metastatic pancreatic cancer.  He was prescribed Eliquis for portal vein thrombosis.  He underwent port placement by Dr. Lucky Cowboy on 08/29/2016.  He attended the chemotherapy class on 08/30/2016.  Symptomatically, he feels "ok".  He remains fatigued.  He rests most of the day.  He is able to take care of his activities of daily living (ADLs).   He denies any pain.  He denies any fever.  He has been on Actos alone.  He is drinking fluids and voiding well.  He denies any nausea, vomiting, or diarrhea.   Past Medical History:  Diagnosis Date  . Diabetes mellitus without complication ([place])   . Hyperlipidemia   . Hypertension   . Renal insufficiency    stage 2 renal dz.     Past Surgical History:  Procedure Laterality Date  . Partial shoulder arthroscopy    . PORTA CATH INSERTION N/A 08/29/2016   Procedure: Glori Luis Cath Insertion;  Surgeon: Algernon Huxley, MD;  Location: [place] [facility];  Service: Cardiovascular;  Laterality: N/A;    History reviewed. No pertinent family history.  Social History:  reports that he has been smoking.  He has been smoking about 1.50 packs per day. He has never used smokeless tobacco. He reports that he drinks alcohol. He reports that he does not use drugs.  He drinks beer on the weekend.  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  He lives in [place].  The patient is accompanied by his wife, Ethan Henry, and his daughter, Ethan Henry, today.  Allergies:  Allergies  Allergen Reactions  . Glipizide Rash    Current Medications: Current Outpatient Prescriptions   Medication Sig Dispense Refill  . amLODipine (NORVASC) 5 MG tablet Take 5 mg by mouth daily.     Marland Kitchen apixaban (ELIQUIS) 2.5 MG TABS tablet Take 1 tablet (2.5 mg total) by mouth 2 (two) times daily. 60 tablet 0  . Blood Glucose Calibration (OT ULTRA/FASTTK CNTRL SOLN) SOLN     . glucose blood (ONE TOUCH ULTRA TEST) test strip     . Lancets (ONETOUCH ULTRASOFT) lancets     . lidocaine-prilocaine (EMLA) cream Apply 1 application topically as needed. 30 g 0  . losartan-hydrochlorothiazide (HYZAAR) 100-25 MG tablet Take 1 tablet by mouth daily.    . Multiple Vitamin (MULTIVITAMIN WITH MINERALS) TABS tablet Take 1 tablet by mouth daily at 12 noon.    . pioglitazone (ACTOS) 45 MG tablet Take 45 mg by mouth daily.    . pravastatin (PRAVACHOL) 40 MG tablet Take 40 mg by mouth every evening.    Marland Kitchen aspirin EC 81 MG tablet Take 81 mg by mouth at bedtime.     . Cholecalciferol (VITAMIN D-1000 MAX ST) 1000 units tablet Take 1,000 Units by mouth daily.     . meloxicam (MOBIC) 7.5 MG tablet Take 7.5 mg by mouth daily.    . Omega-3 Fatty Acids (FISH OIL PO) Take 1 capsule by mouth daily.     . vitamin C (ASCORBIC ACID) 500 MG tablet Take 500 mg by mouth daily.  Current Facility-Administered Medications  Medication Dose Route Frequency Provider Last Rate Last Dose  . ceFAZolin (ANCEF) IVPB 1 g/50 mL premix  1 g Intravenous Once Algernon Huxley, MD        Review of Systems:  GENERAL:  Fatigue.  No fevers or sweats .  Weight loss of 26 pounds (151 pounds to 125 pounds) since 04/2016.  Weight up 1 pound in past week. PERFORMANCE STATUS (ECOG):  1-2 HEENT:  No visual changes, runny nose, sore throat, mouth sores or tenderness. Lungs: No shortness of breath or cough.  No hemoptysis. Cardiac:  No chest pain, palpitations, orthopnea, or PND. GI:  No nausea, vomiting, diarrhea, constipation, melena or hematochezia.  No prior colonoscopy. GU:  No urgency, frequency, dysuria, or hematuria. No change in urine  output. Musculoskeletal:  No back pain.  Intermittent right shoulder pain.  No muscle tenderness. Extremities:  No pain or swelling. Skin:  No rashes or skin changes. Neuro:  No headache, numbness or weakness, balance or coordination issues. Endocrine: Diabetes.  Blood sugar elevated.  No thyroid issues, hot flashes or night sweats. Psych:  No mood changes, depression or anxiety. Pain:  No focal pain. Review of systems:  All other systems reviewed and found to be negative.  Physical Exam: Blood pressure (!) 89/60, pulse (!) 126, temperature 97.1 F (36.2 C), temperature source Tympanic, resp. rate 18, weight 126 lb 15.8 oz (57.6 kg), SpO2 98 %. GENERAL:  Thin elderly gentleman sitting comfortably in the exam room in no acute distress. MENTAL STATUS:  Alert and oriented to person, place and time. HEAD:  Pearline Cables hair.  Normocephalic, atraumatic, face symmetric, no Cushingoid features. EYES:  Glasses.  Dark eyes.  Pupils equal round and reactive to light and accomodation.  Faint scleral incterus.  No conjunctivitis. ENT:  Oropharynx clear without lesion.  Upper and lower dentures.  Tongue normal.  Mucous membranes moist.  RESPIRATORY:  Clear to auscultation without rales, wheezes or rhonchi. CARDIOVASCULAR:  Regular rate and rhythm without murmur, rub or gallop. CHEST:  Right sided port-a-cath. ABDOMEN:  Scaphoid.  Soft, non-tender, with active bowel sounds, and no hepatosplenomegaly.  No masses.  SKIN:  No rashes, ulcers or lesions. EXTREMITIES: No edema, no skin discoloration or tenderness.  No palpable cords. LYMPH NODES: No palpable cervical, supraclavicular, axillary or inguinal adenopathy  NEUROLOGICAL: Unremarkable. PSYCH:  Appropriate.   Appointment on 09/02/2016  Component Date Value Ref Range Status  . WBC 09/02/2016 20.8* 3.8 - 10.6 K/uL Final  . RBC 09/02/2016 3.55* 4.40 - 5.90 MIL/uL Final  . Hemoglobin 09/02/2016 10.8* 13.0 - 18.0 g/dL Final  . HCT 09/02/2016 32.8* 40.0 -  52.0 % Final  . MCV 09/02/2016 92.6  80.0 - 100.0 fL Final  . MCH 09/02/2016 30.4  26.0 - 34.0 pg Final  . MCHC 09/02/2016 32.9  32.0 - 36.0 g/dL Final  . RDW 09/02/2016 14.4  11.5 - 14.5 % Final  . Platelets 09/02/2016 205  150 - 440 K/uL Final  . Neutrophils Relative % 09/02/2016 PENDING  % Incomplete  . Neutro Abs 09/02/2016 PENDING  1.7 - 7.7 K/uL Incomplete  . Band Neutrophils 09/02/2016 PENDING  % Incomplete  . Lymphocytes Relative 09/02/2016 PENDING  % Incomplete  . Lymphs Abs 09/02/2016 PENDING  0.7 - 4.0 K/uL Incomplete  . Monocytes Relative 09/02/2016 PENDING  % Incomplete  . Monocytes Absolute 09/02/2016 PENDING  0.1 - 1.0 K/uL Incomplete  . Eosinophils Relative 09/02/2016 PENDING  % Incomplete  . Eosinophils Absolute 09/02/2016  PENDING  0.0 - 0.7 K/uL Incomplete  . Basophils Relative 09/02/2016 PENDING  % Incomplete  . Basophils Absolute 09/02/2016 PENDING  0.0 - 0.1 K/uL Incomplete  . WBC Morphology 09/02/2016 PENDING   Incomplete  . RBC Morphology 09/02/2016 PENDING   Incomplete  . Smear Review 09/02/2016 PENDING   Incomplete  . Other 09/02/2016 PENDING  % Incomplete  . nRBC 09/02/2016 PENDING  0 /100 WBC Incomplete  . Metamyelocytes Relative 09/02/2016 PENDING  % Incomplete  . Myelocytes 09/02/2016 PENDING  % Incomplete  . Promyelocytes Absolute 09/02/2016 PENDING  % Incomplete  . Blasts 09/02/2016 PENDING  % Incomplete  . Sodium 09/02/2016 129* 135 - 145 mmol/L Final  . Potassium 09/02/2016 4.3  3.5 - 5.1 mmol/L Final  . Chloride 09/02/2016 96* 101 - 111 mmol/L Final  . CO2 09/02/2016 20* 22 - 32 mmol/L Final  . Glucose, Bld 09/02/2016 538* 65 - 99 mg/dL Final   Comment: RESULTS VERIFIED BY REPEAT TESTING CRITICAL RESULT CALLED TO, READ BACK BY AND VERIFIED WITH TRACIE WATSON AT 0900 09/02/2016 KMR   . BUN 09/02/2016 60* 6 - 20 mg/dL Final  . Creatinine, Ser 09/02/2016 2.45* 0.61 - 1.24 mg/dL Final  . Calcium 09/02/2016 8.7* 8.9 - 10.3 mg/dL Final  . Total Protein  09/02/2016 7.2  6.5 - 8.1 g/dL Final  . Albumin 09/02/2016 2.8* 3.5 - 5.0 g/dL Final  . AST 09/02/2016 95* 15 - 41 U/L Final  . ALT 09/02/2016 59  17 - 63 U/L Final  . Alkaline Phosphatase 09/02/2016 559* 38 - 126 U/L Final  . Total Bilirubin 09/02/2016 2.5* 0.3 - 1.2 mg/dL Final  . GFR calc non Af Amer 09/02/2016 23* >60 mL/min Final  . GFR calc Af Amer 09/02/2016 27* >60 mL/min Final   Comment: (NOTE) The eGFR has been calculated using the CKD EPI equation. This calculation has not been validated in all clinical situations. eGFR's persistently <60 mL/min signify possible Chronic Kidney Disease.   . Anion gap 09/02/2016 13  5 - 15 Final  . Magnesium 09/02/2016 2.2  1.7 - 2.4 mg/dL Final    Assessment:  EDWIN BAINES is a 83 y.o. male with metastatic pancreatic cancer.  He presented with diarrhea and weight loss.  He has lost 26 pounds since 04/2016.  Abdomen and pelvic CT scan on 08/11/2016 revealed a 6.5 cm mass in the pancreatic body and tail consistent with primary pancreatic carcinoma.  The mass involved the splenic artery and vein as well as the superior mesenteric vein.  There was diffuse liver metastasis.  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 There was tiny subcentimeter peripancreatic lymph nodes. There was an indeterminate 2 cm left adrenal mass.  CA19-9 was 124,323 on 08/15/2016.  He was unable to have a PET scan secondary to hyperglycemia.  He wishes to defer imaging.  He has a normocytic anemia. Ferritin and iron studies were normal on 07/22/2016.  One of 2 guaiac cards were positive on 08/08/2016.  He has never had a colonoscopy.  He has progressive renal insufficiency.  Creatinine was 1.7, but has increased to 2.45 (CrCl 19 ml/min).  He has liver dysfunction (bilirubin 1.8 on 08/25/2016 and 2.5 today).  Symptomatically, he feels "ok".  He has hyponatremia.  Her has marked hyperglycemia.  Renal insufficincy has progressed.  He has increased  hyperbilirubinemia.  Code status is DNR/DNI.  Plan: 1.  Labs today:  CBC with diff, CMP, Mg. 2.  Discuss multiple electrolyte problems (hyponatremia, hyperglycemia) with  worsening renal insufficiency.  Discussed increased bilirubin.  Discuss plan for management of numerous issues.  Discuss admission to hospital.  Discussed with Dr. Gilford Rile, his primary care physician. 3.  Postpone chemotherapy today. 4.  Anticipate renal consult 5.  Anticipate RUQ ultrasound to r/o biliary obstruction. 6.  Admit to hospital. 7.  RTC next week for MD assessment, labs (CBC with diff, CMP, CA19-9, Mg), and gemcitabine +/- Abraxane   Lequita Asal, MD  09/02/2016, 9:06 AM

## 2016-09-02 NOTE — Progress Notes (Signed)
PT Cancellation Note  Patient Details Name: MURRIEL BECKEN MRN: [id number] DOB: March 28, 1933   Cancelled Treatment:    Reason Eval/Treat Not Completed: Medical issues which prohibited therapy; Pt's blood glucose 563 and trending up, will attempt to see pt at a future date/time when medically appropriate.   Blanche East Emberlin Verner 09/02/2016, 11:28 AM

## 2016-09-02 NOTE — Progress Notes (Signed)
Family Meeting Note  Advance Directive:yes  Today a meeting took place with the Patient.and family The following clinical team members were present during this meeting:MD RN  The following were discussed:Patient's diagnosis: pancreatic cancer , Patient's progosis: Unable to determine and Goals for treatment: DNR  Additional follow-up to be provided: none  Time spent during discussion:20 minutes  Darlinda Bellows, MD

## 2016-09-03 ENCOUNTER — Inpatient Hospital Stay: Payer: PPO

## 2016-09-03 LAB — CBC
HCT: 28.8 % — ABNORMAL LOW (ref 40.0–52.0)
HEMOGLOBIN: 9.9 g/dL — AB (ref 13.0–18.0)
MCH: 31.2 pg (ref 26.0–34.0)
MCHC: 34.2 g/dL (ref 32.0–36.0)
MCV: 91.1 fL (ref 80.0–100.0)
Platelets: 171 10*3/uL (ref 150–440)
RBC: 3.16 MIL/uL — AB (ref 4.40–5.90)
RDW: 14.2 % (ref 11.5–14.5)
WBC: 16.9 10*3/uL — ABNORMAL HIGH (ref 3.8–10.6)

## 2016-09-03 LAB — COMPREHENSIVE METABOLIC PANEL
ALBUMIN: 2.4 g/dL — AB (ref 3.5–5.0)
ALK PHOS: 526 U/L — AB (ref 38–126)
ALT: 57 U/L (ref 17–63)
ANION GAP: 7 (ref 5–15)
AST: 95 U/L — ABNORMAL HIGH (ref 15–41)
BUN: 58 mg/dL — ABNORMAL HIGH (ref 6–20)
CALCIUM: 8 mg/dL — AB (ref 8.9–10.3)
CHLORIDE: 101 mmol/L (ref 101–111)
CO2: 24 mmol/L (ref 22–32)
CREATININE: 2.07 mg/dL — AB (ref 0.61–1.24)
GFR calc non Af Amer: 28 mL/min — ABNORMAL LOW (ref >60)
GFR, EST AFRICAN AMERICAN: 33 mL/min — AB (ref >60)
GLUCOSE: 194 mg/dL — AB (ref 65–99)
Potassium: 4.2 mmol/L (ref 3.5–5.1)
SODIUM: 132 mmol/L — AB (ref 135–145)
Total Bilirubin: 2.1 mg/dL — ABNORMAL HIGH (ref 0.3–1.2)
Total Protein: 6.1 g/dL — ABNORMAL LOW (ref 6.5–8.1)

## 2016-09-03 LAB — URINALYSIS, ROUTINE W REFLEX MICROSCOPIC
BILIRUBIN URINE: NEGATIVE
GLUCOSE, UA: 50 mg/dL — AB
HGB URINE DIPSTICK: NEGATIVE
Ketones, ur: NEGATIVE mg/dL
Leukocytes, UA: NEGATIVE
Nitrite: NEGATIVE
PROTEIN: NEGATIVE mg/dL
Specific Gravity, Urine: 1.013 (ref 1.005–1.030)
pH: 6 (ref 5.0–8.0)

## 2016-09-03 LAB — GLUCOSE, CAPILLARY
GLUCOSE-CAPILLARY: 139 mg/dL — AB (ref 65–99)
GLUCOSE-CAPILLARY: 201 mg/dL — AB (ref 65–99)
Glucose-Capillary: 167 mg/dL — ABNORMAL HIGH (ref 65–99)
Glucose-Capillary: 178 mg/dL — ABNORMAL HIGH (ref 65–99)
Glucose-Capillary: 205 mg/dL — ABNORMAL HIGH (ref 65–99)
Glucose-Capillary: 184 mg/dL — ABNORMAL HIGH (ref 65–99)
Glucose-Capillary: 236 mg/dL — ABNORMAL HIGH (ref 65–99)
Glucose-Capillary: 227 mg/dL — ABNORMAL HIGH (ref 65–99)
Glucose-Capillary: 228 mg/dL — ABNORMAL HIGH (ref 65–99)

## 2016-09-03 MED ORDER — HEPARIN SOD (PORK) LOCK FLUSH 100 UNIT/ML IV SOLN
250.0000 [IU] | Freq: Once | INTRAVENOUS | Status: AC
  Administered 2016-09-03: 250 [IU] via INTRAVENOUS
  Filled 2016-09-03: qty 3

## 2016-09-03 MED ORDER — INSULIN GLARGINE 100 UNIT/ML ~~LOC~~ SOLN
10.0000 [IU] | SUBCUTANEOUS | Status: DC
  Administered 2016-09-03: 10 [IU] via SUBCUTANEOUS
  Filled 2016-09-03 (×2): qty 0.1

## 2016-09-03 MED ORDER — PIOGLITAZONE HCL 15 MG PO TABS
45.0000 mg | ORAL_TABLET | Freq: Every day | ORAL | Status: DC
  Administered 2016-09-03: 45 mg via ORAL
  Filled 2016-09-03: qty 1

## 2016-09-03 NOTE — Progress Notes (Signed)
PT Cancellation Note  Patient Details Name: ZAKHI UYEHARA MRN: [id number] DOB: 11/05/1932   Cancelled Treatment:    Reason Eval/Treat Not Completed: Patient not medically ready. Patient transferred to ICU around [place] yesterday. Will require new PT orders when medically appropriate.   Dorice Lamas, PT, DPT 09/03/2016, 8:03 AM

## 2016-09-03 NOTE — Discharge Instructions (Signed)
Heart healthy and ADA diet. °

## 2016-09-03 NOTE — Progress Notes (Signed)
Spoke with MD that sugar at noon is 236, was prior 184. Will order home actos, give dose now and recheck sugar at 1400.

## 2016-09-03 NOTE — Progress Notes (Signed)
Spoke with PT, will see patient today before discharge.

## 2016-09-03 NOTE — Discharge Summary (Signed)
[facility] [place] NAME: Ethan Henry    MR#:  [id number]  DATE OF BIRTH:  1933-04-13  DATE OF ADMISSION:  09/02/2016   ADMITTING PHYSICIAN: Vaughan Basta, MD  DATE OF DISCHARGE:  09/03/2016 PRIMARY CARE PHYSICIAN: Madelyn Brunner, MD   ADMISSION DIAGNOSIS:  hyperglycemia pancreatic mass with liver mets hyperbilirubinemia DISCHARGE DIAGNOSIS:  Active Problems:   Hyperglycemia Acute kidney injury on chronic kidney disease stage II: SECONDARY DIAGNOSIS:   Past Medical History:  Diagnosis Date  . Diabetes mellitus without complication ([place])   . Hyperlipidemia   . Hypertension   . Pancreatic cancer ([place])   . Renal insufficiency    stage 2 renal dz.    HOSPITAL COURSE:   83 year old male with metastatic pancreatic cancer and diabetes who was seen at the oncologist office and found to have electrolyte abnormalities including hyperglycemia.  1. Hyperglycemia: Improved with insulin drip,  Started diabetes diet, Lantus 10 units daily and sliding scale, blood sugars is stable during the day. resumed Actos.  2. Hyponatremia in the setting of hyperglycemia. Improved.  3. Acute kidney injury on chronic kidney disease stage II:  Improved to baseline with IV fluid support. avoid Nephrotoxic agents. discontinue lisinopril-HCTZ and meloxicam due to renal failure.  4. Elevated bilirubin: Stable, possible due to liver metastasis.  5. Metastatic pancreatic cancer:  Chemotherapy postponed. Follow-up with oncologist as outpatient.   6. Essential HTN: Blood pressure is normal. Resume Norvasc, but discontinue lisinopril-HCTZ due to renal failure.  DISCHARGE CONDITIONS:  Stable, discharge to home today. CONSULTS OBTAINED:   DRUG ALLERGIES:   Allergies  Allergen Reactions  . Glipizide Rash   DISCHARGE MEDICATIONS:   Allergies as of 09/03/2016      Reactions   Glipizide Rash      Medication List    STOP taking  these medications   losartan-hydrochlorothiazide 100-25 MG tablet Commonly known as:  HYZAAR   meloxicam 7.5 MG tablet Commonly known as:  MOBIC     TAKE these medications   amLODipine 5 MG tablet Commonly known as:  NORVASC Take 5 mg by mouth daily.   apixaban 2.5 MG Tabs tablet Commonly known as:  ELIQUIS Take 1 tablet (2.5 mg total) by mouth 2 (two) times daily.   aspirin EC 81 MG tablet Take 81 mg by mouth at bedtime.   FISH OIL PO Take 1 capsule by mouth daily.   lidocaine-prilocaine cream Commonly known as:  EMLA Apply 1 application topically as needed.   multivitamin with minerals Tabs tablet Take 1 tablet by mouth daily at 12 noon.   ONE TOUCH ULTRA TEST test strip Generic drug:  glucose blood   onetouch ultrasoft lancets   OT ULTRA/FASTTK CNTRL SOLN Soln   pioglitazone 45 MG tablet Commonly known as:  ACTOS Take 45 mg by mouth daily.   pravastatin 40 MG tablet Commonly known as:  PRAVACHOL Take 40 mg by mouth every evening.   vitamin C 500 MG tablet Commonly known as:  ASCORBIC ACID Take 500 mg by mouth daily.   VITAMIN D-1000 MAX ST 1000 units tablet Generic drug:  Cholecalciferol Take 1,000 Units by mouth daily.        DISCHARGE INSTRUCTIONS:  See AVS.  If you experience worsening of your admission symptoms, develop shortness of breath, life threatening emergency, suicidal or homicidal thoughts you must seek medical attention immediately by calling 911 or calling your MD immediately  if symptoms less severe.  You  Must read complete instructions/literature along with all the possible adverse reactions/side effects for all the Medicines you take and that have been prescribed to you. Take any new Medicines after you have completely understood and accpet all the possible adverse reactions/side effects.   Please note  You were cared for by a hospitalist during your hospital stay. If you have any questions about your discharge medications or the  care you received while you were in the hospital after you are discharged, you can call the unit and asked to speak with the hospitalist on call if the hospitalist that took care of you is not available. Once you are discharged, your primary care physician will handle any further medical issues. Please note that NO REFILLS for any discharge medications will be authorized once you are discharged, as it is imperative that you return to your primary care physician (or establish a relationship with a primary care physician if you do not have one) for your aftercare needs so that they can reassess your need for medications and monitor your lab values.    On the day of Discharge:  VITAL SIGNS:  Blood pressure 111/63, pulse (!) 54, temperature 97.8 F (36.6 C), temperature source Axillary, resp. rate 18, height 5\' 7"  (1.702 m), weight 130 lb 1.1 oz (59 kg), SpO2 93 %. PHYSICAL EXAMINATION:  GENERAL:  83 y.o.-year-old patient lying in the bed with no acute distress.  EYES: Pupils equal, round, reactive to light and accommodation. No scleral icterus. Extraocular muscles intact.  HEENT: Head atraumatic, normocephalic. Oropharynx and nasopharynx clear.  NECK:  Supple, no jugular venous distention. No thyroid enlargement, no tenderness.  LUNGS: Normal breath sounds bilaterally, no wheezing, rales,rhonchi or crepitation. No use of accessory muscles of respiration.  CARDIOVASCULAR: S1, S2 normal. No murmurs, rubs, or gallops.  ABDOMEN: Soft, non-tender, non-distended. Bowel sounds present. No organomegaly or mass.  EXTREMITIES: No pedal edema, cyanosis, or clubbing.  NEUROLOGIC: Cranial nerves II through XII are intact. Muscle strength 5/5 in all extremities. Sensation intact. Gait not checked.  PSYCHIATRIC: The patient is alert and oriented x 3.  SKIN: No obvious rash, lesion, or ulcer.  DATA REVIEW:   CBC  Recent Labs Lab 09/03/16 0516  WBC 16.9*  HGB 9.9*  HCT 28.8*  PLT 171    Chemistries    Recent Labs Lab 09/02/16 0829 09/03/16 0516  NA 129* 132*  K 4.3 4.2  CL 96* 101  CO2 20* 24  GLUCOSE 538* 194*  BUN 60* 58*  CREATININE 2.45* 2.07*  CALCIUM 8.7* 8.0*  MG 2.2  --   AST 95* 95*  ALT 59 57  ALKPHOS 559* 526*  BILITOT 2.5* 2.1*     Microbiology Results  Results for orders placed or performed during the hospital encounter of 09/02/16  MRSA PCR Screening     Status: None   Collection Time: 09/02/16  6:54 PM  Result Value Ref Range Status   MRSA by PCR NEGATIVE NEGATIVE Final    Comment:        The GeneXpert MRSA Assay (FDA approved for NASAL specimens only), is one component of a comprehensive MRSA colonization surveillance program. It is not intended to diagnose MRSA infection nor to guide or monitor treatment for MRSA infections.     RADIOLOGY:  US Abdomen Limited Ruq  Result Date: 09/03/2016 CLINICAL DATA:  83 year old male with elevated bilirubin. Patient with metastatic pancreatic carcinoma to the liver. EXAM: US ABDOMEN LIMITED - RIGHT UPPER QUADRANT COMPARISON:  08/11/2016 CT  FINDINGS: Gallbladder: Mild gallbladder wall thickening with probable sludge within the gallbladder. There is no evidence of sonographic Murphy sign. Common bile duct: Diameter: 5.8 mm. There is no evidence of CBD or intrahepatic biliary dilatation. Liver: Heterogeneity of the liver with hepatic mass is are identified as seen on recent CT. Thrombus within the portal vein is noted. A tiny amount of ascites adjacent to the liver is again noted. IMPRESSION: No evidence of biliary dilatation. Hepatic metastases again noted with tiny amount of perihepatic ascites and thrombus within the portal vein. Mildly thick-walled gallbladder with gallbladder sludge which is likely related to hepatic dysfunction or possibly metastatic disease. Electronically Signed   By: Margarette Canada M.D.   On: 09/03/2016 09:16     Management plans discussed with the patient, family and they are in  agreement.  CODE STATUS: DNR   TOTAL TIME TAKING CARE OF THIS PATIENT: 40 minutes.    Demetrios Loll M.D on 09/03/2016 at 2:29 PM  Between 7am to 6pm - Pager - [phone]  After 6pm go to [email or web address] - Proofreader  [facility]  Office  [phone]  CC: Primary care physician; Madelyn Brunner, MD   Note: This dictation was prepared with Dragon dictation along with smaller phrase technology. Any transcriptional errors that result from this process are unintentional.

## 2016-09-03 NOTE — Progress Notes (Signed)
MD notified of 1400 FBS and said it was okay to discharge pt. Pt took Actos po at 1400. Repeat FBS at 1500 still in 200s. Wife has discharge instructions and knows follow up appt dates, what medications pt took today, and what he needs to take this evening. Porta cath de-accessed. No bleeding at site. Pt wheeled out hospital to car with family and NT at side. Pt stated he felt a little weak but not sick. He is ready to go home.

## 2016-09-03 NOTE — Progress Notes (Signed)
Physical Therapy Evaluation Patient Details Name: Ethan Henry MRN: [id number] DOB: 02-14-1933 Today's Date: 09/03/2016   History of Present Illness  Patient is an 83 y.o. male admitted on 23 FEB after having abnormal labs, found to be hyperglycemic.   Clinical Impression  Patient is a pleasant male admitted for above reasons. Patient previously independent, living at home with wife who is presently still working. Upon evaluation, patient demonstrates independence with bed mobility, transfers, and gait of short distances with no LOB performing turns and following commands. While patient does have some shoulder ROM limitations and decreased muscle strength, it is believed that this is his baseline level of function and that he is safe to d/c home when medically ready without f/u.    Follow Up Recommendations No PT follow up    Equipment Recommendations  None recommended by PT    Recommendations for Other Services       Precautions / Restrictions Precautions Precautions: Fall Restrictions Weight Bearing Restrictions: No      Mobility  Bed Mobility Overal bed mobility: Independent             General bed mobility comments: Patient moves from supine to sit and sit to supine independently.  Transfers Overall transfer level: Independent Equipment used: None             General transfer comment: Patient transfer from sit to stand and stand to sit safely with no AD.  Ambulation/Gait Ambulation/Gait assistance: Min guard Ambulation Distance (Feet): 20 Feet Assistive device: None       General Gait Details: Patient ambulates within room ~20' demonstrating ability to make turns and follow commands with no LOB or significant change in vital signs.  Stairs            Wheelchair Mobility    Modified Rankin (Stroke Patients Only)       Balance Overall balance assessment: Independent                                           Pertinent  Vitals/Pain Pain Assessment: No/denies pain    Home Living Family/patient expects to be discharged to:: Private residence Living Arrangements: Spouse/significant other Available Help at Discharge: Family Type of Home: House Home Access: Stairs to enter Entrance Stairs-Rails: Can reach both Entrance Stairs-Number of Steps: 3 Home Layout: One level Home Equipment: None      Prior Function Level of Independence: Independent               Hand Dominance        Extremity/Trunk Assessment   Upper Extremity Assessment Upper Extremity Assessment: Generalized weakness;RUE deficits/detail RUE Deficits / Details: Shoulder flexion ~90 deg. due to past joint replacement    Lower Extremity Assessment Lower Extremity Assessment: Generalized weakness       Communication   Communication: No difficulties  Cognition Arousal/Alertness: Awake/alert Behavior During Therapy: WFL for tasks assessed/performed Overall Cognitive Status: Within Functional Limits for tasks assessed                      General Comments      Exercises     Assessment/Plan    PT Assessment Patent does not need any further PT services  PT Problem List Decreased strength;Decreased range of motion       PT Treatment Interventions      PT Goals (Current  goals can be found in the Care Plan section)  Acute Rehab PT Goals Patient Stated Goal: "To go home" PT Goal Formulation: With patient Time For Goal Achievement: 09/17/16 Potential to Achieve Goals: Good    Frequency     Barriers to discharge        Co-evaluation               End of Session Equipment Utilized During Treatment: Gait belt Activity Tolerance: Patient tolerated treatment well Patient left: in bed;with call bell/phone within reach;with family/visitor present Nurse Communication: Mobility status PT Visit Diagnosis: Muscle weakness (generalized) (M62.81)         Time: [id number] PT Time Calculation (min)  (ACUTE ONLY): 15 min   Charges:   PT Evaluation $PT Eval Low Complexity: 1 Procedure     PT G Codes:         Dorice Lamas, PT, DPT 09/03/2016, 12:08 PM

## 2016-09-05 DIAGNOSIS — E1121 Type 2 diabetes mellitus with diabetic nephropathy: Secondary | ICD-10-CM | POA: Diagnosis not present

## 2016-09-05 LAB — GLUCOSE, CAPILLARY: Glucose-Capillary: 161 mg/dL — ABNORMAL HIGH (ref 65–99)

## 2016-09-08 NOTE — Progress Notes (Signed)
Hematology/Oncology Consult note [facility]  Telephone:([phone] [phone]  Patient Care Team: Madelyn Brunner, MD as PCP - General (Internal Medicine) Clent Jacks, RN as Registered Nurse Manya Silvas, MD (Gastroenterology) Lavera Guise, PA-C as Physician Assistant   Name of the patient: Ethan Henry  SN:[id number]  04-Dec-1932   Date of visit: 09/08/16  Diagnosis- metastatic pancreatic cancer with liver mets  Chief complaint/ Reason for visit- on treatment assessment prior to cycle #1 of chemotherapy  Heme/Onc history: Ethan Henry is a 83 y.o. male with metastatic pancreatic cancer.  He presented with diarrhea and weight loss.  He has lost 26 pounds since 04/2016.  Abdomen and pelvic CT scan on 08/11/2016 revealed a 6.5 cm mass in the pancreatic body and tail consistent with primary pancreatic carcinoma.  The mass involved the splenic artery and vein as well as the superior mesenteric vein.  There was diffuse liver metastasis.  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 There was tiny subcentimeter peripancreatic lymph nodes. There was an indeterminate 2 cm left adrenal mass.  CA19-9 was 124,323 on 08/15/2016.  He was unable to have a PET scan secondary to hyperglycemia.  He wishes to defer imaging.  He has a normocytic anemia. Ferritin and iron studies were normal on 07/22/2016.  One of 2 guaiac cards were positive on 08/08/2016.  He has never had a colonoscopy.  He has progressive renal insufficiency.  Creatinine was 1.7, but has increased to 2.45 (CrCl 19 ml/min).  He has liver dysfunction (bilirubin 1.8 on 08/25/2016 and 2.5 today).  Patient is on eliquis for portal venous thrombosis  Patient was last seen by Dr. Mike Gip on 09/02/2016 and chemotherapy had to be postponed on that day due to his hyperglycemia and hyponatremia as well as petechiae. He was admitted to the hospital for 1 day and discharged on  09/03/2016.   Interval history- since patient's hospital discharge she reports feeling well although he spends majority of his time laying on his chair. He is only been getting up to ambulate to go to the bathroom. He denies any pain. Patient was discharged on metformin and his dose was increased with thousand milligrams twice a day by his PCP. Actos was also added for glycemic control.  however his blood sugars have been running in the high 300s since his hospital discharge  ECOG PS- 2-3 Pain scale- 0 Opioid associated constipation- no  Review of systems- Review of Systems  Constitutional: Positive for malaise/fatigue. Negative for chills, fever and weight loss.  HENT: Negative for congestion, ear discharge and nosebleeds.   Eyes: Negative for blurred vision.  Respiratory: Negative for cough, hemoptysis, sputum production, shortness of breath and wheezing.   Cardiovascular: Negative for chest pain, palpitations, orthopnea and claudication.  Gastrointestinal: Negative for abdominal pain, blood in stool, constipation, diarrhea, heartburn, melena, nausea and vomiting.  Genitourinary: Negative for dysuria, flank pain, frequency, hematuria and urgency.  Musculoskeletal: Negative for back pain, joint pain and myalgias.  Skin: Negative for rash.  Neurological: Negative for dizziness, tingling, focal weakness, seizures, weakness and headaches.  Endo/Heme/Allergies: Does not bruise/bleed easily.  Psychiatric/Behavioral: Negative for depression and suicidal ideas. The patient does not have insomnia.      Current treatment- gem/abraxane  Allergies  Allergen Reactions  . Glipizide Rash     Past Medical History:  Diagnosis Date  . Diabetes mellitus without complication ([place])   . Hyperlipidemia   . Hypertension   .  Pancreatic cancer ([place])   . Renal insufficiency    stage 2 renal dz.      Past Surgical History:  Procedure Laterality Date  . Partial shoulder arthroscopy    . PORTA  CATH INSERTION N/A 08/29/2016   Procedure: Glori Luis Cath Insertion;  Surgeon: Algernon Huxley, MD;  Location: [facility];  Service: Cardiovascular;  Laterality: N/A;    Social History   Social History  . Marital status: Married    Spouse name: N/A  . Number of children: N/A  . Years of education: N/A   Occupational History  . Not on file.   Social History Main Topics  . Smoking status: Current Some Day Smoker    Packs/day: 1.50  . Smokeless tobacco: Never Used     Comment: Smokes only on weekends  . Alcohol use Yes     Comment: occasional  . Drug use: No  . Sexual activity: No   Other Topics Concern  . Not on file   Social History Narrative  . No narrative on file    No family history on file.   Current Outpatient Prescriptions:  .  amLODipine (NORVASC) 5 MG tablet, Take 5 mg by mouth daily. , Disp: , Rfl:  .  apixaban (ELIQUIS) 2.5 MG TABS tablet, Take 1 tablet (2.5 mg total) by mouth 2 (two) times daily., Disp: 60 tablet, Rfl: 0 .  aspirin EC 81 MG tablet, Take 81 mg by mouth at bedtime. , Disp: , Rfl:  .  Blood Glucose Calibration (OT ULTRA/FASTTK CNTRL SOLN) SOLN, , Disp: , Rfl:  .  Cholecalciferol (VITAMIN D-1000 MAX ST) 1000 units tablet, Take 1,000 Units by mouth daily. , Disp: , Rfl:  .  glucose blood (ONE TOUCH ULTRA TEST) test strip, , Disp: , Rfl:  .  Lancets (ONETOUCH ULTRASOFT) lancets, , Disp: , Rfl:  .  lidocaine-prilocaine (EMLA) cream, Apply 1 application topically as needed., Disp: 30 g, Rfl: 0 .  Multiple Vitamin (MULTIVITAMIN WITH MINERALS) TABS tablet, Take 1 tablet by mouth daily at 12 noon., Disp: , Rfl:  .  Omega-3 Fatty Acids (FISH OIL PO), Take 1 capsule by mouth daily. , Disp: , Rfl:  .  pioglitazone (ACTOS) 45 MG tablet, Take 45 mg by mouth daily., Disp: , Rfl:  .  pravastatin (PRAVACHOL) 40 MG tablet, Take 40 mg by mouth every evening., Disp: , Rfl:  .  vitamin C (ASCORBIC ACID) 500 MG tablet, Take 500 mg by mouth daily. , Disp: , Rfl:    Current Facility-Administered Medications:  .  ceFAZolin (ANCEF) IVPB 1 g/50 mL premix, 1 g, Intravenous, Once, Algernon Huxley, MD  Physical exam:  Vitals:   09/09/16 0843  BP: 117/87  Pulse: 92  Resp: 18  Temp: (!) 96.4 F (35.8 C)  TempSrc: Tympanic  Weight: 131 lb 9.8 oz (59.7 kg)   Physical Exam  Constitutional: He is oriented to person, place, and time.  Elderly frail gentleman sitting in a wheelchair.  HENT:  Head: Normocephalic and atraumatic.  Sclera mildly icteric  Eyes: EOM are normal. Pupils are equal, round, and reactive to light.  Neck: Normal range of motion.  Cardiovascular: Normal rate, regular rhythm and normal heart sounds.   Pulmonary/Chest: Effort normal and breath sounds normal.  Abdominal: Soft. Bowel sounds are normal.  Neurological: He is alert and oriented to person, place, and time.  Skin: Skin is warm and dry.     CMP Latest Ref Rng & Units 09/09/2016  Glucose 65 - 99 mg/dL 398(H)  BUN 6 - 20 mg/dL 58(H)  Creatinine 0.61 - 1.24 mg/dL 2.35(H)  Sodium 135 - 145 mmol/L 130(L)  Potassium 3.5 - 5.1 mmol/L 4.3  Chloride 101 - 111 mmol/L 100(L)  CO2 22 - 32 mmol/L 18(L)  Calcium 8.9 - 10.3 mg/dL 8.6(L)  Total Protein 6.5 - 8.1 g/dL 7.1  Total Bilirubin 0.3 - 1.2 mg/dL 2.9(H)  Alkaline Phos 38 - 126 U/L 723(H)  AST 15 - 41 U/L 92(H)  ALT 17 - 63 U/L 57   CBC Latest Ref Rng & Units 09/09/2016  WBC 3.8 - 10.6 K/uL 17.0(H)  Hemoglobin 13.0 - 18.0 g/dL 10.6(L)  Hematocrit 40.0 - 52.0 % 31.9(L)  Platelets 150 - 440 K/uL 307    No images are attached to the encounter.  Ct Abdomen Pelvis W Contrast  Result Date: 08/11/2016 CLINICAL DATA:  20 pound weight loss in past year. Elevated liver function tests. Diarrhea. EXAM: CT ABDOMEN AND PELVIS WITH CONTRAST TECHNIQUE: Multidetector CT imaging of the abdomen and pelvis was performed using the standard protocol following bolus administration of intravenous contrast. CONTRAST:  49mL ISOVUE-300 IOPAMIDOL  (ISOVUE-300) INJECTION 61% COMPARISON:  None. FINDINGS: Lower Chest: No acute findings. Hepatobiliary: Diffuse low-attenuation liver masses, consistent with diffuse liver metastases. Index lesion in dome of right hepatic lobe measures 3.3 x 2.8 cm on image 12/2. Gallbladder is unremarkable. No evidence of biliary ductal dilatation. Pancreas: Solid-appearing mass in the pancreatic body and tail measures 6.5 x 3.0 cm. This is consistent with primary pancreatic carcinoma. This mass involves the splenic artery and vein, and superior mesenteric vein. 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 Spleen: Within normal limits in size and appearance. Adrenals/Urinary Tract: 2 cm left adrenal mass is indeterminate. Normal appearance of right adrenal gland. Multiple bilateral renal cysts are seen, however there is no evidence of renal mass or hydronephrosis. Tiny nonobstructive intrarenal calculi are noted bilaterally. No evidence of hydronephrosis. Stomach/Bowel: Tiny hiatal hernia. No evidence of obstruction, inflammatory process or abnormal fluid collections. Diffuse colonic diverticulosis, without evidence of diverticulitis. Vascular/Lymphatic: No pathologically enlarged lymph nodes identified, however tiny sub-cm peripancreatic lymph nodes are seen, and early lymph node metastases cannot be excluded. No abdominal aortic aneurysm. Aortic atherosclerosis. Reproductive:  No mass identified. Other: Minimal ascites in right upper quadrant and pelvis. Small left inguinal hernia containing only fat. Musculoskeletal:  No suspicious bone lesions identified. IMPRESSION: 6.5 cm mass in the pancreatic body and tail, consistent with primary pancreatic carcinoma. This mass involves the splenic artery and vein, as well as the superior mesenteric vein. Diffuse liver metastases. Superior mesenteric and portal vein thrombosis noted. Tiny sub-cm peripancreatic lymph nodes are not pathologically enlarged, however lymph  node metastases cannot be excluded. Indeterminate 2 cm left adrenal mass. Electronically Signed   By: Earle Gell M.D.   On: 08/11/2016 12:01   US Biopsy  Result Date: 08/18/2016 CLINICAL DATA:  Pancreatic mass with multiple liver lesions. EXAM: ULTRASOUND GUIDED CORE BIOPSY OF LIVER MEDICATIONS: 1.0 mg IV Versed;  mcg IV Fentanyl Total Moderate Sedation Time: 8 minutes. The patient's level of consciousness and physiologic status were continuously monitored during the procedure by Radiology nursing. PROCEDURE: The procedure, risks, benefits, and alternatives were explained to the patient. Questions regarding the procedure were encouraged and answered. The patient understands and consents to the procedure. A time out was performed prior to initiating the procedure. The abdominal wall was prepped with chlorhexidine in a sterile fashion, and  a sterile drape was applied covering the operative field. A sterile gown and sterile gloves were used for the procedure. Local anesthesia was provided with 1% Lidocaine. Ultrasound was performed of the liver. Under direct ultrasound guidance, a 17 gauge needle was advanced into the left lobe. Coaxial 18 gauge core biopsies were performed. Three separate core biopsy samples were submitted in formalin. COMPLICATIONS: None. FINDINGS: The liver is extremely heterogeneous with lesions demonstrating vague margination. Compared to the right lobe, it was felt that the left lobe would yield better tissue. Solid tissue was obtained. IMPRESSION: Ultrasound-guided core biopsy performed of lesions within the left lobe of the liver. Electronically Signed   By: Aletta Edouard M.D.   On: 08/18/2016 13:56   US Abdomen Limited Ruq  Result Date: 09/03/2016 CLINICAL DATA:  83 year old male with elevated bilirubin. Patient with metastatic pancreatic carcinoma to the liver. EXAM: US ABDOMEN LIMITED - RIGHT UPPER QUADRANT COMPARISON:  08/11/2016 CT FINDINGS: Gallbladder: Mild gallbladder wall  thickening with probable sludge within the gallbladder. There is no evidence of sonographic Murphy sign. Common bile duct: Diameter: 5.8 mm. There is no evidence of CBD or intrahepatic biliary dilatation. Liver: Heterogeneity of the liver with hepatic mass is are identified as seen on recent CT. Thrombus within the portal vein is noted. A tiny amount of ascites adjacent to the liver is again noted. IMPRESSION: No evidence of biliary dilatation. Hepatic metastases again noted with tiny amount of perihepatic ascites and thrombus within the portal vein. Mildly thick-walled gallbladder with gallbladder sludge which is likely related to hepatic dysfunction or possibly metastatic disease. Electronically Signed   By: Margarette Canada M.D.   On: 09/03/2016 09:16     Assessment and plan- Patient is a 83 y.o. male with a history of metastatic pancreatic cancer with liver metastases.. Today he is being seen for cycle #1 of chemotherapy with gemcitabine and Abraxane  On review of his CMP his blood sugar is 398 today. Also his bilirubin is up to 2.9 and creatinine is 2.35. Patient's performance status has been slowly declining. I discussed all this with the patient as well as Dr. Mike Gip over the phone. With the elevated bilirubin he would not be able to get Abraxane. He would need Decadron for premedications for chemotherapy which would further worsen his blood sugars. Hence I do not think we can proceed with chemotherapy today. I will give him 1 L of normal saline overnight to help him with his hyponatremia as well as hyperglycemia induced dehydration. He will see Dr. Mike Gip next week and decide about initiating chemotherapy at that time. I did discuss that metastatic pancreatic cancer is an aggressive malignancy and in the light of abnormal blood work as well as poor performance status he may not be a candidate for chemotherapy and hospice would be a reasonable option to consider at this time. Patient and his family would  like to think more about this and get back to [place] next week. We will touch base with his PCP regarding his glycemic control   Total face to face encounter time for this patient visit was 30 min. >50% of the time was  spent in counseling and coordination of care.    Visit Diagnosis 1. Malignant neoplasm of pancreas, unspecified location of malignancy ([place])   2. Goals of care, counseling/discussion      Dr. Randa Evens, MD, MPH [facility] at [facility] Pager- [phone] 09/09/2016 11:53 AM

## 2016-09-09 ENCOUNTER — Other Ambulatory Visit: Payer: Self-pay | Admitting: Hematology and Oncology

## 2016-09-09 ENCOUNTER — Inpatient Hospital Stay: Payer: PPO

## 2016-09-09 ENCOUNTER — Inpatient Hospital Stay: Payer: PPO | Attending: Oncology | Admitting: Oncology

## 2016-09-09 VITALS — BP 117/87 | HR 92 | Temp 96.4°F | Resp 18 | Wt 131.6 lb

## 2016-09-09 DIAGNOSIS — N281 Cyst of kidney, acquired: Secondary | ICD-10-CM | POA: Diagnosis not present

## 2016-09-09 DIAGNOSIS — I10 Essential (primary) hypertension: Secondary | ICD-10-CM | POA: Diagnosis not present

## 2016-09-09 DIAGNOSIS — F17210 Nicotine dependence, cigarettes, uncomplicated: Secondary | ICD-10-CM | POA: Insufficient documentation

## 2016-09-09 DIAGNOSIS — E279 Disorder of adrenal gland, unspecified: Secondary | ICD-10-CM | POA: Insufficient documentation

## 2016-09-09 DIAGNOSIS — C259 Malignant neoplasm of pancreas, unspecified: Secondary | ICD-10-CM | POA: Insufficient documentation

## 2016-09-09 DIAGNOSIS — Z7901 Long term (current) use of anticoagulants: Secondary | ICD-10-CM | POA: Diagnosis not present

## 2016-09-09 DIAGNOSIS — E1165 Type 2 diabetes mellitus with hyperglycemia: Secondary | ICD-10-CM | POA: Diagnosis not present

## 2016-09-09 DIAGNOSIS — D649 Anemia, unspecified: Secondary | ICD-10-CM | POA: Insufficient documentation

## 2016-09-09 DIAGNOSIS — E785 Hyperlipidemia, unspecified: Secondary | ICD-10-CM | POA: Insufficient documentation

## 2016-09-09 DIAGNOSIS — I700 Atherosclerosis of aorta: Secondary | ICD-10-CM | POA: Insufficient documentation

## 2016-09-09 DIAGNOSIS — E871 Hypo-osmolality and hyponatremia: Secondary | ICD-10-CM | POA: Diagnosis not present

## 2016-09-09 DIAGNOSIS — C787 Secondary malignant neoplasm of liver and intrahepatic bile duct: Secondary | ICD-10-CM | POA: Diagnosis not present

## 2016-09-09 DIAGNOSIS — Z66 Do not resuscitate: Secondary | ICD-10-CM | POA: Diagnosis not present

## 2016-09-09 DIAGNOSIS — Z7982 Long term (current) use of aspirin: Secondary | ICD-10-CM | POA: Insufficient documentation

## 2016-09-09 DIAGNOSIS — K4090 Unilateral inguinal hernia, without obstruction or gangrene, not specified as recurrent: Secondary | ICD-10-CM | POA: Diagnosis not present

## 2016-09-09 DIAGNOSIS — I81 Portal vein thrombosis: Secondary | ICD-10-CM | POA: Insufficient documentation

## 2016-09-09 DIAGNOSIS — E860 Dehydration: Secondary | ICD-10-CM | POA: Insufficient documentation

## 2016-09-09 DIAGNOSIS — Z79899 Other long term (current) drug therapy: Secondary | ICD-10-CM | POA: Diagnosis not present

## 2016-09-09 DIAGNOSIS — Z7189 Other specified counseling: Secondary | ICD-10-CM

## 2016-09-09 LAB — CBC WITH DIFFERENTIAL/PLATELET
Basophils Absolute: 0.1 10*3/uL (ref 0–0.1)
Basophils Relative: 1 %
Eosinophils Absolute: 0.3 10*3/uL (ref 0–0.7)
Eosinophils Relative: 2 %
HCT: 31.9 % — ABNORMAL LOW (ref 40.0–52.0)
Hemoglobin: 10.6 g/dL — ABNORMAL LOW (ref 13.0–18.0)
Lymphocytes Relative: 7 %
Lymphs Abs: 1.2 10*3/uL (ref 1.0–3.6)
MCH: 30.5 pg (ref 26.0–34.0)
MCHC: 33.3 g/dL (ref 32.0–36.0)
MCV: 91.6 fL (ref 80.0–100.0)
Monocytes Absolute: 1.8 10*3/uL — ABNORMAL HIGH (ref 0.2–1.0)
Monocytes Relative: 11 %
Neutro Abs: 13.6 10*3/uL — ABNORMAL HIGH (ref 1.4–6.5)
Neutrophils Relative %: 79 %
Platelets: 307 10*3/uL (ref 150–440)
RBC: 3.48 MIL/uL — ABNORMAL LOW (ref 4.40–5.90)
RDW: 14.7 % — ABNORMAL HIGH (ref 11.5–14.5)
WBC: 17 10*3/uL — ABNORMAL HIGH (ref 3.8–10.6)

## 2016-09-09 LAB — COMPREHENSIVE METABOLIC PANEL
ALT: 57 U/L (ref 17–63)
AST: 92 U/L — ABNORMAL HIGH (ref 15–41)
Albumin: 2.7 g/dL — ABNORMAL LOW (ref 3.5–5.0)
Alkaline Phosphatase: 723 U/L — ABNORMAL HIGH (ref 38–126)
Anion gap: 12 (ref 5–15)
BUN: 58 mg/dL — ABNORMAL HIGH (ref 6–20)
CO2: 18 mmol/L — ABNORMAL LOW (ref 22–32)
Calcium: 8.6 mg/dL — ABNORMAL LOW (ref 8.9–10.3)
Chloride: 100 mmol/L — ABNORMAL LOW (ref 101–111)
Creatinine, Ser: 2.35 mg/dL — ABNORMAL HIGH (ref 0.61–1.24)
GFR calc Af Amer: 28 mL/min — ABNORMAL LOW (ref >60)
GFR calc non Af Amer: 24 mL/min — ABNORMAL LOW (ref >60)
Glucose, Bld: 398 mg/dL — ABNORMAL HIGH (ref 65–99)
Potassium: 4.3 mmol/L (ref 3.5–5.1)
Sodium: 130 mmol/L — ABNORMAL LOW (ref 135–145)
Total Bilirubin: 2.9 mg/dL — ABNORMAL HIGH (ref 0.3–1.2)
Total Protein: 7.1 g/dL (ref 6.5–8.1)

## 2016-09-09 LAB — MAGNESIUM: Magnesium: 2.1 mg/dL (ref 1.7–2.4)

## 2016-09-09 MED ORDER — SODIUM CHLORIDE 0.9% FLUSH
10.0000 mL | INTRAVENOUS | Status: DC | PRN
  Administered 2016-09-09: 10 mL via INTRAVENOUS
  Filled 2016-09-09: qty 10

## 2016-09-09 MED ORDER — HEPARIN SOD (PORK) LOCK FLUSH 100 UNIT/ML IV SOLN
500.0000 [IU] | Freq: Once | INTRAVENOUS | Status: AC
  Administered 2016-09-09: 500 [IU] via INTRAVENOUS
  Filled 2016-09-09: qty 5

## 2016-09-09 MED ORDER — LORAZEPAM 0.5 MG PO TABS: 0.5000 mg | ORAL_TABLET | Freq: Four times a day (QID) | ORAL | 0 refills | Status: DC | PRN

## 2016-09-09 MED ORDER — SODIUM CHLORIDE 0.9 % IV SOLN
Freq: Once | INTRAVENOUS | Status: AC
  Administered 2016-09-09: 10:00:00 via INTRAVENOUS
  Filled 2016-09-09: qty 1000

## 2016-09-09 MED ORDER — ONDANSETRON HCL 8 MG PO TABS: 8.0000 mg | ORAL_TABLET | Freq: Two times a day (BID) | ORAL | 1 refills | Status: DC | PRN

## 2016-09-09 MED ORDER — PROCHLORPERAZINE MALEATE 10 MG PO TABS: 10.0000 mg | ORAL_TABLET | Freq: Four times a day (QID) | ORAL | 1 refills | Status: DC | PRN

## 2016-09-09 MED ORDER — LIDOCAINE-PRILOCAINE 2.5-2.5 % EX CREA: TOPICAL_CREAM | CUTANEOUS | 3 refills | Status: DC

## 2016-09-09 NOTE — Progress Notes (Signed)
Patient started on metformin 500 mg 2 tablets twice a day.

## 2016-09-10 LAB — CANCER ANTIGEN 19-9: CA 19-9: 401240 U/mL — ABNORMAL HIGH (ref 0–35)

## 2016-09-16 ENCOUNTER — Encounter: Payer: Self-pay | Admitting: Hematology and Oncology

## 2016-09-16 ENCOUNTER — Inpatient Hospital Stay: Payer: PPO

## 2016-09-16 ENCOUNTER — Inpatient Hospital Stay (HOSPITAL_BASED_OUTPATIENT_CLINIC_OR_DEPARTMENT_OTHER): Payer: PPO | Admitting: Hematology and Oncology

## 2016-09-16 VITALS — BP 131/81 | HR 96 | Temp 96.7°F | Resp 18 | Wt 134.5 lb

## 2016-09-16 DIAGNOSIS — E871 Hypo-osmolality and hyponatremia: Secondary | ICD-10-CM

## 2016-09-16 DIAGNOSIS — C787 Secondary malignant neoplasm of liver and intrahepatic bile duct: Secondary | ICD-10-CM | POA: Diagnosis not present

## 2016-09-16 DIAGNOSIS — D649 Anemia, unspecified: Secondary | ICD-10-CM

## 2016-09-16 DIAGNOSIS — F17210 Nicotine dependence, cigarettes, uncomplicated: Secondary | ICD-10-CM

## 2016-09-16 DIAGNOSIS — C259 Malignant neoplasm of pancreas, unspecified: Secondary | ICD-10-CM

## 2016-09-16 DIAGNOSIS — E860 Dehydration: Secondary | ICD-10-CM | POA: Diagnosis not present

## 2016-09-16 DIAGNOSIS — Z79899 Other long term (current) drug therapy: Secondary | ICD-10-CM | POA: Diagnosis not present

## 2016-09-16 DIAGNOSIS — E1165 Type 2 diabetes mellitus with hyperglycemia: Secondary | ICD-10-CM

## 2016-09-16 LAB — CBC WITH DIFFERENTIAL/PLATELET
Basophils Absolute: 0.1 10*3/uL (ref 0–0.1)
Basophils Relative: 1 %
EOS ABS: 0.4 10*3/uL (ref 0–0.7)
Eosinophils Relative: 2 %
HEMATOCRIT: 32.4 % — AB (ref 40.0–52.0)
HEMOGLOBIN: 10.6 g/dL — AB (ref 13.0–18.0)
LYMPHS ABS: 1.7 10*3/uL (ref 1.0–3.6)
Lymphocytes Relative: 9 %
MCH: 30.2 pg (ref 26.0–34.0)
MCHC: 32.8 g/dL (ref 32.0–36.0)
MCV: 92.1 fL (ref 80.0–100.0)
MONO ABS: 2 10*3/uL — AB (ref 0.2–1.0)
Monocytes Relative: 11 %
NEUTROS ABS: 14.4 10*3/uL — AB (ref 1.4–6.5)
NEUTROS PCT: 77 %
Platelets: 283 10*3/uL (ref 150–440)
RBC: 3.52 MIL/uL — ABNORMAL LOW (ref 4.40–5.90)
RDW: 15.3 % — AB (ref 11.5–14.5)
WBC: 18.6 10*3/uL — ABNORMAL HIGH (ref 3.8–10.6)

## 2016-09-16 LAB — COMPREHENSIVE METABOLIC PANEL
ALK PHOS: 754 U/L — AB (ref 38–126)
ALT: 61 U/L (ref 17–63)
ANION GAP: 13 (ref 5–15)
AST: 106 U/L — ABNORMAL HIGH (ref 15–41)
Albumin: 2.6 g/dL — ABNORMAL LOW (ref 3.5–5.0)
BILIRUBIN TOTAL: 3.8 mg/dL — AB (ref 0.3–1.2)
BUN: 57 mg/dL — ABNORMAL HIGH (ref 6–20)
CALCIUM: 8.6 mg/dL — AB (ref 8.9–10.3)
CO2: 18 mmol/L — ABNORMAL LOW (ref 22–32)
Chloride: 102 mmol/L (ref 101–111)
Creatinine, Ser: 2.49 mg/dL — ABNORMAL HIGH (ref 0.61–1.24)
GFR calc non Af Amer: 23 mL/min — ABNORMAL LOW (ref >60)
GFR, EST AFRICAN AMERICAN: 26 mL/min — AB (ref >60)
Glucose, Bld: 367 mg/dL — ABNORMAL HIGH (ref 65–99)
Potassium: 4.7 mmol/L (ref 3.5–5.1)
Sodium: 133 mmol/L — ABNORMAL LOW (ref 135–145)
TOTAL PROTEIN: 6.9 g/dL (ref 6.5–8.1)

## 2016-09-16 NOTE — Progress Notes (Signed)
[facility] day:  09/16/2016  Chief Complaint: Ethan Henry is a 83 y.o. male with metastatic pancreatic cancer who is seen for reassessment.  HPI:   The patient was last seen by me in the medical oncology clinic on 09/02/2016.  At that time, he had hyponatremia and marked hyperglycemia (blood sugar > 500).  Renal insufficiecy had progressed.  He had increased hyperbilirubinemia.  Decision was made to postpone chemotherapy and admit to the hospital  He was admitted to [facility] from 09/02/2016 - 09/03/2016.  He was in the ICU on an insulin drip.  His hyponatremia improved with control of his blood sugar.  Lisinopril-HCTZ and meloxicam were discontinued.  RUQ ultrasound revealed no evidence of biliary ductal dilatation.  He was seen by Dr. Janese Banks in my absence on 09/09/2016.  Blood sugar was 398.  Creatinine was 2.35.  Bilirubin was 2.9.  He was not felt a candidate for chemotherapy.  He was given IVF.  Bilirubin has increased over time: 1.8 on 08/25/2016, 2.5 on 09/02/2016, and 2.9 on 09/09/2016.  Alkaline phosphatase has increased form 634 on 08/25/2016 to 723 on 09/09/2016.  Symptomatically, he denies any pain.  He states that he spends his days resting and watching television.  He gets up to use the restroom.  He is not eating much.  He snacks during the day.  Blood sugar has remained high (300s).  He is on oral hypoglycemics.  He gets short of breath getting dressed.   Past Medical History:  Diagnosis Date  . Diabetes mellitus without complication ([place])   . Hyperlipidemia   . Hypertension   . Pancreatic cancer ([place])   . Renal insufficiency    stage 2 renal dz.     Past Surgical History:  Procedure Laterality Date  . Partial shoulder arthroscopy    . PORTA CATH INSERTION N/A 08/29/2016   Procedure: Glori Luis Cath Insertion;  Surgeon: Algernon Huxley, MD;  Location: [facility];  Service: Cardiovascular;  Laterality: N/A;    History reviewed.  No pertinent family history.  Social History:  reports that he has been smoking.  He has been smoking about 1.50 packs per day. He has never used smokeless tobacco. He reports that he drinks alcohol. He reports that he does not use drugs.  He drinks beer on the weekend.  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  His wife is a Theme park manager and works Tuesdays, Wednesdays, and Thursdays.  He lives in [place].  The patient is accompanied by his wife, Ethan Henry, and his daughter, Ethan Henry, today.  Allergies:  Allergies  Allergen Reactions  . Glipizide Rash    Current Medications: Current Outpatient Prescriptions  Medication Sig Dispense Refill  . amLODipine (NORVASC) 5 MG tablet Take 5 mg by mouth daily.     Marland Kitchen apixaban (ELIQUIS) 2.5 MG TABS tablet Take 1 tablet (2.5 mg total) by mouth 2 (two) times daily. 60 tablet 0  . aspirin EC 81 MG tablet Take 81 mg by mouth at bedtime.     . Blood Glucose Calibration (OT ULTRA/FASTTK CNTRL SOLN) SOLN     . Cholecalciferol (VITAMIN D-1000 MAX ST) 1000 units tablet Take 1,000 Units by mouth daily.     Marland Kitchen glucose blood (ONE TOUCH ULTRA TEST) test strip     . Lancets (ONETOUCH ULTRASOFT) lancets     . lidocaine-prilocaine (EMLA) cream Apply 1 application topically as needed. 30 g 0  . lidocaine-prilocaine (EMLA)  cream Apply to affected area once 30 g 3  . LORazepam (ATIVAN) 0.5 MG tablet Take 1 tablet (0.5 mg total) by mouth every 6 (six) hours as needed (Nausea or vomiting). 30 tablet 0  . metFORMIN (GLUCOPHAGE) 500 MG tablet Take 1,000 mg by mouth 2 (two) times daily.    . Multiple Vitamin (MULTIVITAMIN WITH MINERALS) TABS tablet Take 1 tablet by mouth daily at 12 noon.    . Omega-3 Fatty Acids (FISH OIL PO) Take 1 capsule by mouth daily.     . ondansetron (ZOFRAN) 8 MG tablet Take 1 tablet (8 mg total) by mouth 2 (two) times daily as needed (Nausea or vomiting). 30 tablet 1  . pioglitazone (ACTOS) 45 MG tablet Take 45 mg by mouth daily.    . pravastatin  (PRAVACHOL) 40 MG tablet Take 40 mg by mouth every evening.    . prochlorperazine (COMPAZINE) 10 MG tablet Take 1 tablet (10 mg total) by mouth every 6 (six) hours as needed (Nausea or vomiting). 30 tablet 1  . vitamin C (ASCORBIC ACID) 500 MG tablet Take 500 mg by mouth daily.      Current Facility-Administered Medications  Medication Dose Route Frequency Provider Last Rate Last Dose  . ceFAZolin (ANCEF) IVPB 1 g/50 mL premix  1 g Intravenous Once Algernon Huxley, MD        Review of Systems:  GENERAL:  Fatigue.  No fevers or sweats .  Weight gain of 8 pounds. PERFORMANCE STATUS (ECOG):  2-3. HEENT:  No visual changes, runny nose, sore throat, mouth sores or tenderness. Lungs: Shortness of breath with minimal exertion.  No cough.  No hemoptysis. Cardiac:  No chest pain, palpitations, orthopnea, or PND. GI:  Poor appetite.  Nausea.  No vomiting, diarrhea, constipation, melena or hematochezia.  GU:  No urgency, frequency, dysuria, or hematuria. No change in urine output. Musculoskeletal:  No back pain.  Intermittent right shoulder pain.  No muscle tenderness. Extremities:  No pain or swelling. Skin:  No rashes or skin changes. Neuro:  No headache, numbness or weakness, balance or coordination issues. Endocrine: Diabetes (see HPI).  No thyroid issues, hot flashes or night sweats. Psych:  No mood changes, depression or anxiety. Pain:  No focal pain. Review of systems:  All other systems reviewed and found to be negative.  Physical Exam: Blood pressure 131/81, pulse 96, temperature (!) 96.7 F (35.9 C), temperature source Tympanic, resp. rate 18, weight 134 lb 7.7 oz (61 kg). GENERAL:  Thin fatigued appearing elderly gentleman sitting comfortably in the exam room in a wheelchair in no acute distress. MENTAL STATUS:  Alert and oriented to person, place and time. HEAD:  Pearline Cables hair.  Normocephalic, atraumatic, face symmetric, no Cushingoid features. EYES:  Glasses.  Scleral icterus.  Pupils equal  round and reactive to light and accomodation.  No conjunctivitis. ENT:  Oropharynx clear without lesion.  Upper and lower dentures.  Tongue normal.  Mucous membranes moist.  RESPIRATORY:  Clear to auscultation without rales, wheezes or rhonchi. CARDIOVASCULAR:  Regular rate and rhythm without murmur, rub or gallop. CHEST:  Right sided port-a-cath. ABDOMEN:  Scaphoid.  Soft, non-tender, with active bowel sounds, and no hepatosplenomegaly.  No masses.  SKIN:  No rashes, ulcers or lesions. EXTREMITIES: No edema, no skin discoloration or tenderness.  No palpable cords. LYMPH NODES: No palpable cervical, supraclavicular, axillary or inguinal adenopathy  NEUROLOGICAL: Unremarkable. PSYCH:  Appropriate.   Appointment on 09/16/2016  Component Date Value Ref Range Status  .  WBC 09/16/2016 18.6* 3.8 - 10.6 K/uL Final  . RBC 09/16/2016 3.52* 4.40 - 5.90 MIL/uL Final  . Hemoglobin 09/16/2016 10.6* 13.0 - 18.0 g/dL Final  . HCT 09/16/2016 32.4* 40.0 - 52.0 % Final  . MCV 09/16/2016 92.1  80.0 - 100.0 fL Final  . MCH 09/16/2016 30.2  26.0 - 34.0 pg Final  . MCHC 09/16/2016 32.8  32.0 - 36.0 g/dL Final  . RDW 09/16/2016 15.3* 11.5 - 14.5 % Final  . Platelets 09/16/2016 283  150 - 440 K/uL Final  . Neutrophils Relative % 09/16/2016 77  % Final  . Neutro Abs 09/16/2016 14.4* 1.4 - 6.5 K/uL Final  . Lymphocytes Relative 09/16/2016 9  % Final  . Lymphs Abs 09/16/2016 1.7  1.0 - 3.6 K/uL Final  . Monocytes Relative 09/16/2016 11  % Final  . Monocytes Absolute 09/16/2016 2.0* 0.2 - 1.0 K/uL Final  . Eosinophils Relative 09/16/2016 2  % Final  . Eosinophils Absolute 09/16/2016 0.4  0 - 0.7 K/uL Final  . Basophils Relative 09/16/2016 1  % Final  . Basophils Absolute 09/16/2016 0.1  0 - 0.1 K/uL Final  . Sodium 09/16/2016 133* 135 - 145 mmol/L Final  . Potassium 09/16/2016 4.7  3.5 - 5.1 mmol/L Final  . Chloride 09/16/2016 102  101 - 111 mmol/L Final  . CO2 09/16/2016 18* 22 - 32 mmol/L Final  .  Glucose, Bld 09/16/2016 367* 65 - 99 mg/dL Final  . BUN 09/16/2016 57* 6 - 20 mg/dL Final  . Creatinine, Ser 09/16/2016 2.49* 0.61 - 1.24 mg/dL Final  . Calcium 09/16/2016 8.6* 8.9 - 10.3 mg/dL Final  . Total Protein 09/16/2016 6.9  6.5 - 8.1 g/dL Final  . Albumin 09/16/2016 2.6* 3.5 - 5.0 g/dL Final  . AST 09/16/2016 106* 15 - 41 U/L Final  . ALT 09/16/2016 61  17 - 63 U/L Final  . Alkaline Phosphatase 09/16/2016 754* 38 - 126 U/L Final  . Total Bilirubin 09/16/2016 3.8* 0.3 - 1.2 mg/dL Final  . GFR calc non Af Amer 09/16/2016 23* >60 mL/min Final  . GFR calc Af Amer 09/16/2016 26* >60 mL/min Final   Comment: (NOTE) The eGFR has been calculated using the CKD EPI equation. This calculation has not been validated in all clinical situations. eGFR's persistently <60 mL/min signify possible Chronic Kidney Disease.   . Anion gap 09/16/2016 13  5 - 15 Final    Assessment:  Ethan Henry is a 83 y.o. male with metastatic pancreatic cancer.  He presented with diarrhea and weight loss.  He has lost 26 pounds since 04/2016.  Abdomen and pelvic CT scan on 08/11/2016 revealed a 6.5 cm mass in the pancreatic body and tail consistent with primary pancreatic carcinoma.  The mass involved the splenic artery and vein as well as the superior mesenteric vein.  There was diffuse liver metastasis.  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 There was tiny subcentimeter peripancreatic lymph nodes. There was an indeterminate 2 cm left adrenal mass.    CA19-9 has been followed: [phone],[phone] on 08/15/2016 and 401,240 on 09/09/2016.  He was unable to have a PET scan secondary to hyperglycemia.  He wished to defer imaging.  He has a normocytic anemia. Ferritin and iron studies were normal on 07/22/2016.  One of 2 guaiac cards were positive on 08/08/2016.  He has never had a colonoscopy.  He has progressive renal insufficiency.  Creatinine was 1.7 but has increased to 2.49 (CrCl 20 ml/min).  He has  progressive liver dysfunction (bilirubin 1.8 on 08/25/2016 and 3.8 on 09/16/2016).  He has had difficult to control diabetes.  Symptomatically, he is fatigued.  Performance status is declining.  Code status is DNR/DNI.  Plan: 1.  Labs today:  CBC with diff, CMP. 2.  Review events over last 2 weeks with progressive disease.  Discuss RUQ ultrasound.  No biliary ductal dilatation amendable to stent placement.  Discuss rapidly increasing CA19-9.  Discuss inability to receive Abraxane.  Discuss low likelihood of benefit from single agent gemcitabine.  Discuss supportive care.  Discuss Hospice with goal of helping him feel as good as he can.  Patient agreeable to meet with Hospice.  We discussed follow-up in clinic as needed.  Multiple questions were asked and answered. 3.  Discuss diabetes.  Discuss consideration of insulin.  Will discuss with Dr. Lisette Grinder. 4.  Hospice consult. 5.  RTC prn   Lequita Asal, MD  09/16/2016, 9:35 AM

## 2016-09-16 NOTE — Progress Notes (Signed)
Patient presents today in wheelchair.  States he has indigestion today.  Patient threw up in exam room today.  States he snacks a lot during the day but does not have a real appetite.  Wife states he takes about 2 alka seltzers a day.

## 2016-09-19 NOTE — Addendum Note (Signed)
Encounter addended by: Vernie Murders, RT on: 09/19/2016  9:38 AM<BR>    Actions taken: Imaging Exam ended, Charge Capture section accepted

## 2016-09-20 ENCOUNTER — Telehealth: Payer: Self-pay | Admitting: *Deleted

## 2016-09-20 MED ORDER — OMEPRAZOLE 20 MG PO CPDR: 20.0000 mg | DELAYED_RELEASE_CAPSULE | Freq: Every day | ORAL | 2 refills | Status: AC

## 2016-09-20 MED ORDER — ONDANSETRON 4 MG PO TBDP: 4.0000 mg | ORAL_TABLET | Freq: Three times a day (TID) | ORAL | 2 refills | Status: AC | PRN

## 2016-09-20 NOTE — Telephone Encounter (Signed)
He was opened to services and she has some questions 1- Diabetes- he is on Metformin and it is giving him diarrhea causing dehydration, asking if he can be changed to insulin as discussed at office visit B/P 96/60 he is dizzy when rising to standing position  Needs Imodium ordered 2- Acid reflux needs med ordered for it, belching a lot 3- Nausea, Needs zofran  Please advise

## 2016-09-20 NOTE — Telephone Encounter (Signed)
  We had sent a message to his PCP about his insulin.  Prilosec 20 mg po q day for reflux  Ondansetron 4 mg po q 8 hours pn nausea.  M

## 2016-10-06 ENCOUNTER — Telehealth: Payer: Self-pay | Admitting: *Deleted

## 2016-10-06 NOTE — Telephone Encounter (Signed)
Called to report that he expired at 1:00 PM today at home

## 2016-10-09 DEATH — deceased

## 2016-10-20 ENCOUNTER — Other Ambulatory Visit: Payer: Self-pay | Admitting: Nurse Practitioner

## 2017-04-20 IMAGING — US US BIOPSY
1 series · 6 of 6 positions shown · non-contrast
Comparison: none

CLINICAL DATA: Pancreatic mass with multiple liver lesions.

[Series 1: us biopsy · 0.16mm/px · 6 of 6 slices shown]
[im 1/6]
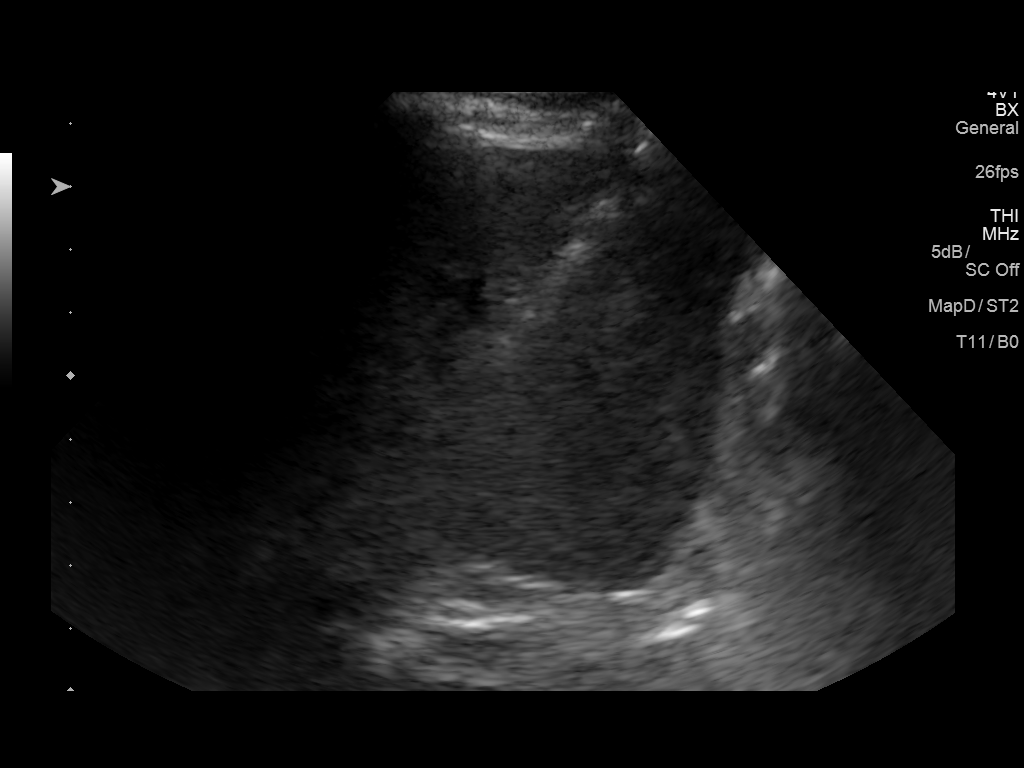
[im 2/6]
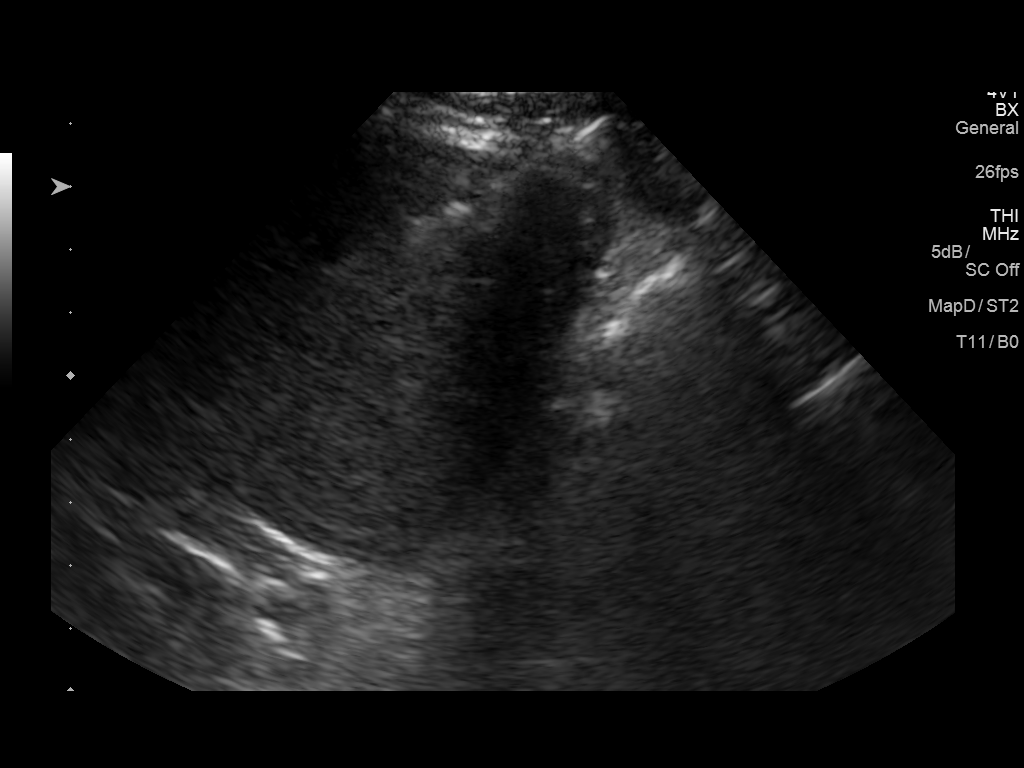
[im 3/6]
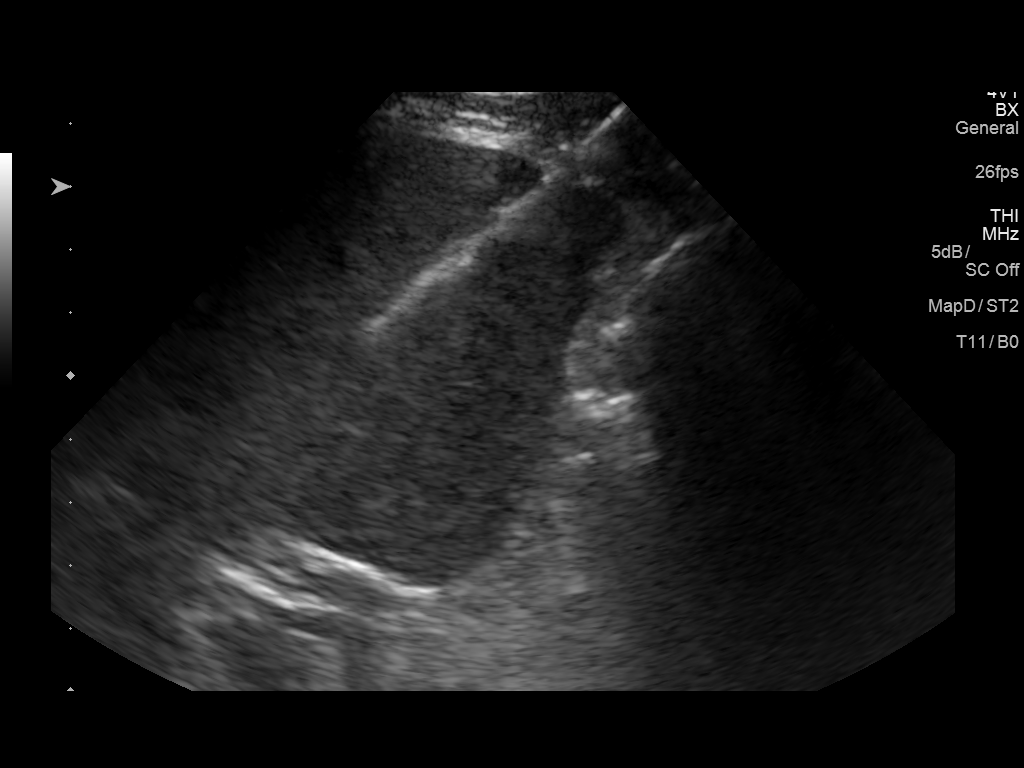
[im 4/6]
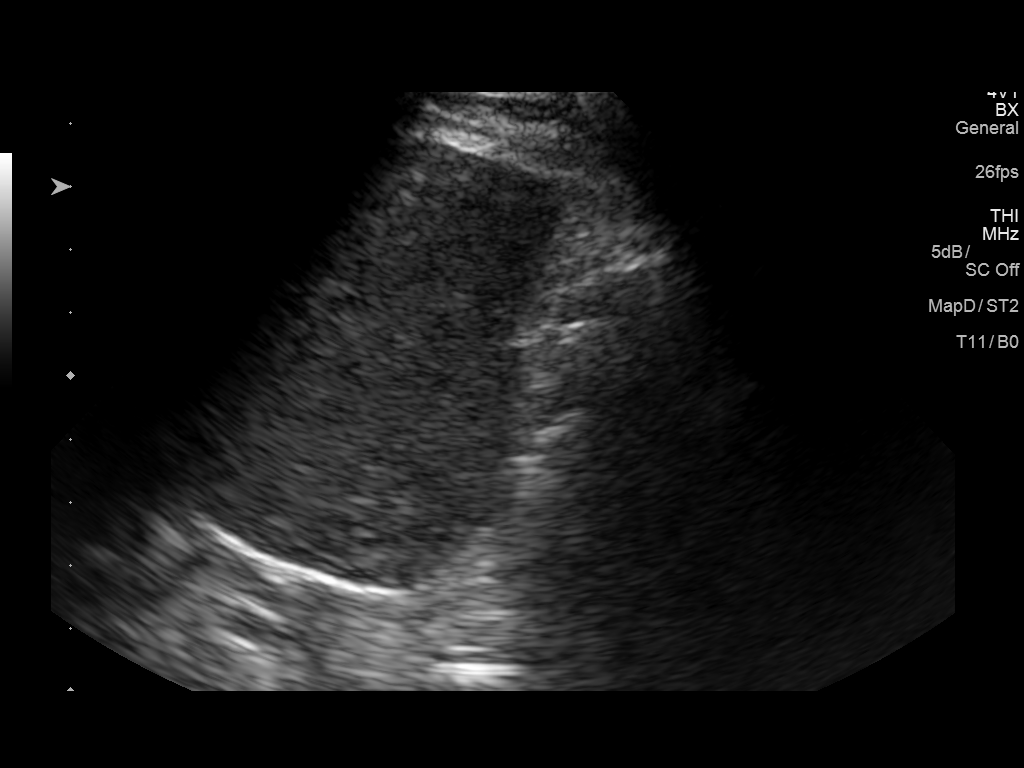
[im 5/6]
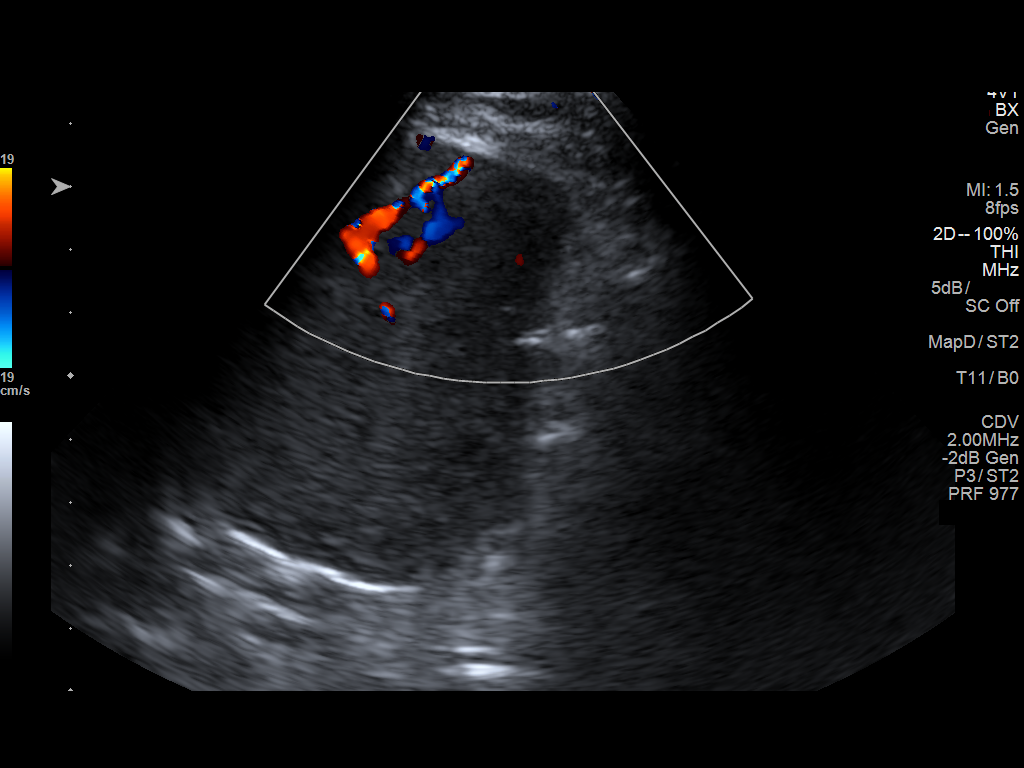
[im 6/6]
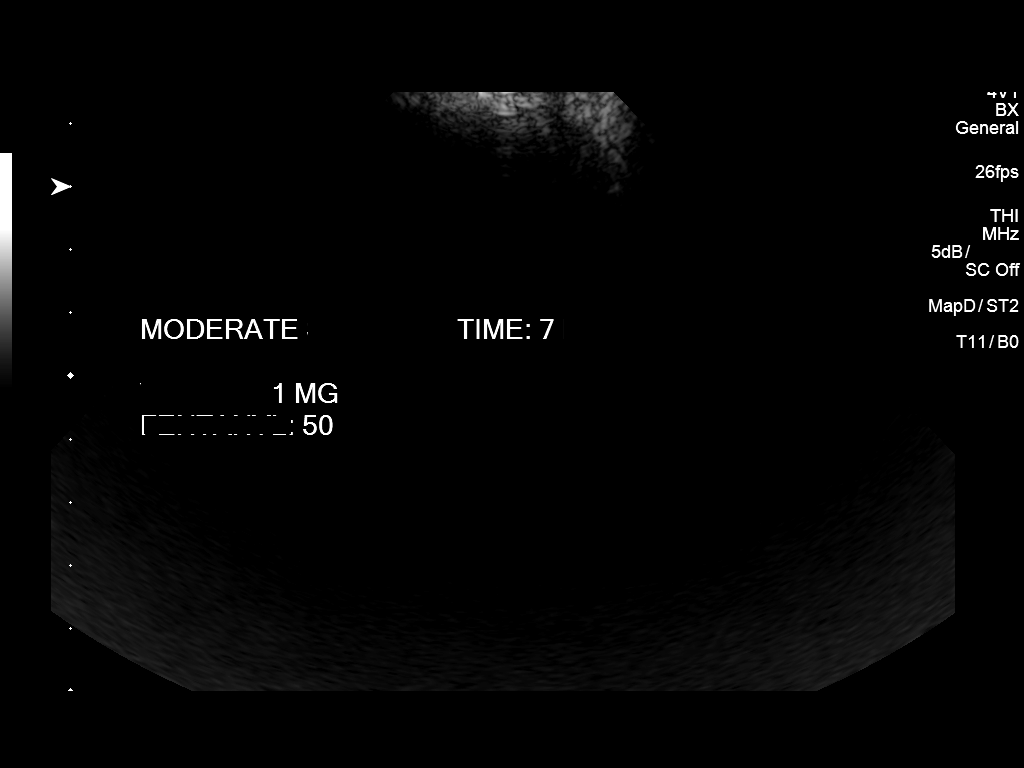

[6 of 6 positions shown; findings below may reference images not displayed]

EXAM:
ULTRASOUND GUIDED CORE BIOPSY OF LIVER

MEDICATIONS:
1.0 mg IV Versed;  mcg IV Fentanyl

Total Moderate Sedation Time: 8 minutes.

The patient's level of consciousness and physiologic status were
continuously monitored during the procedure by Radiology nursing.

PROCEDURE:
The procedure, risks, benefits, and alternatives were explained to
the patient. Questions regarding the procedure were encouraged and
answered. The patient understands and consents to the procedure. A
time out was performed prior to initiating the procedure.

The abdominal wall was prepped with chlorhexidine in a sterile
fashion, and a sterile drape was applied covering the operative
field. A sterile gown and sterile gloves were used for the
procedure. Local anesthesia was provided with 1% Lidocaine.

Ultrasound was performed of the liver. Under direct ultrasound
guidance, a 17 gauge needle was advanced into the left lobe. Coaxial
18 gauge core biopsies were performed. Three separate core biopsy
samples were submitted in formalin.

COMPLICATIONS:
None.
FINDINGS: The liver is extremely heterogeneous with lesions demonstrating
vague margination. Compared to the right lobe, it was felt that the
left lobe would yield better tissue. Solid tissue was obtained.
IMPRESSION: Ultrasound-guided core biopsy performed of lesions within the left
lobe of the liver.

## 2017-05-06 IMAGING — US US ABDOMEN LIMITED
1 series · 14 of 25 positions shown · non-contrast
Comparison: 08/11/2016 CT

CLINICAL DATA: 83-year-old male with elevated bilirubin. Patient
with metastatic pancreatic carcinoma to the liver.

EXAM:
US ABDOMEN LIMITED - RIGHT UPPER QUADRANT

[Series 1: us abdomen limited · 0.14mm/px · 14 of 48 slices shown]
[im 1/48]
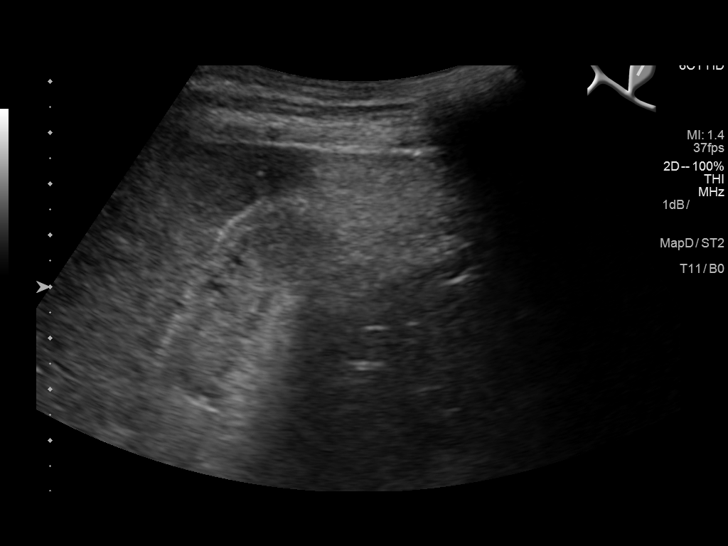
[im 4/48]
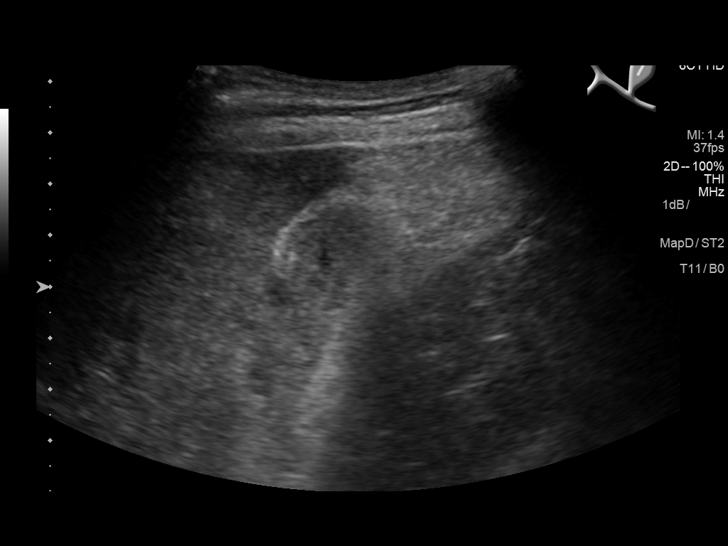
[im 8/48]
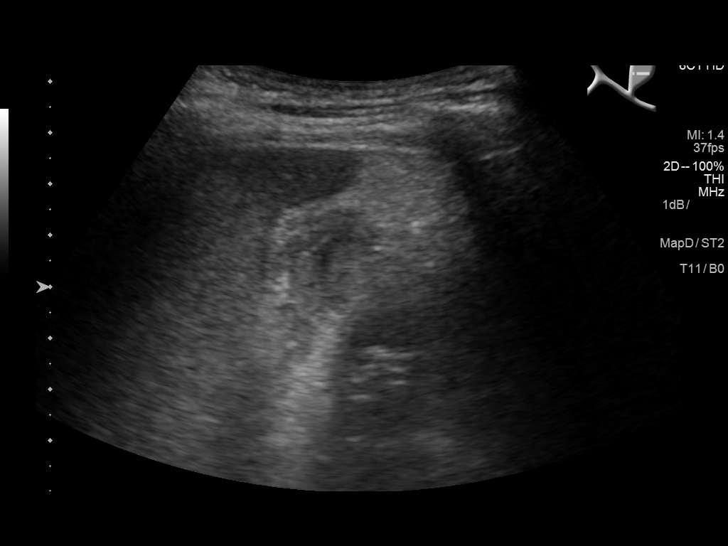
[im 12/48]
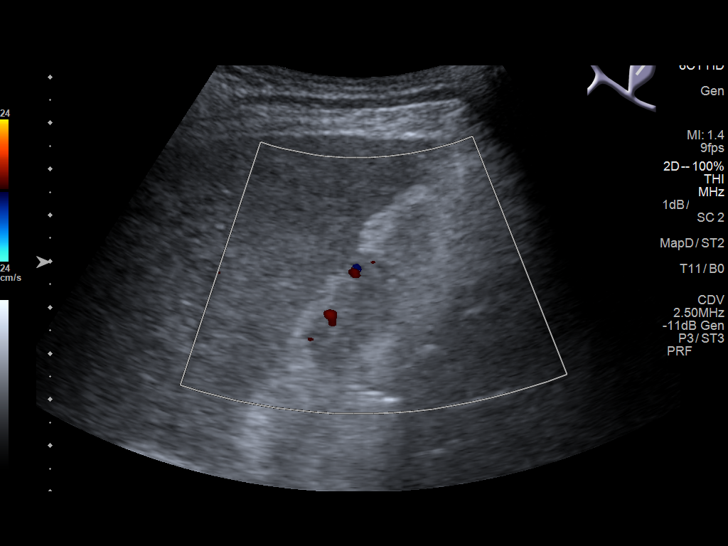
[im 16/48]
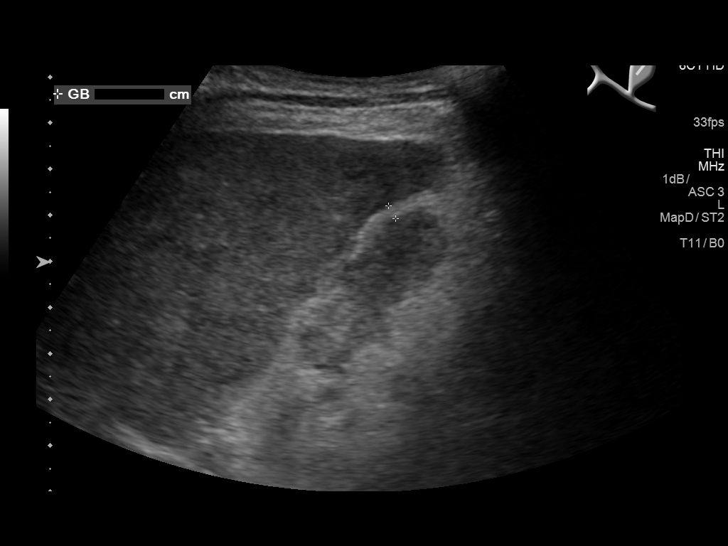
[im 18/48]
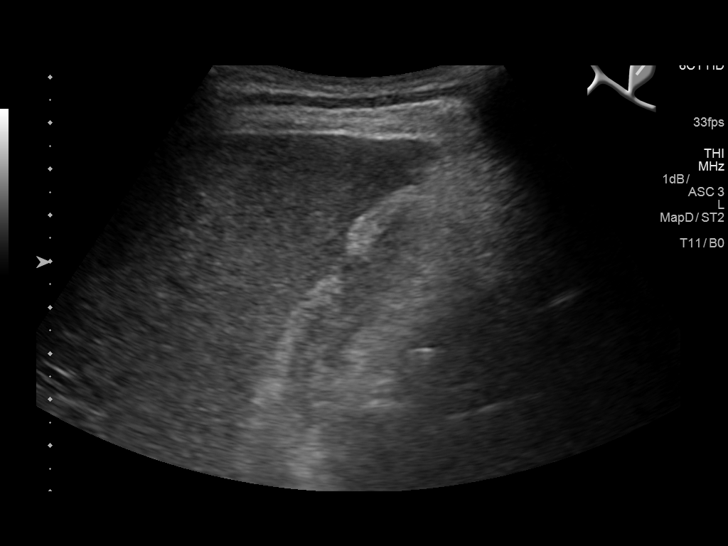
[im 22/48]
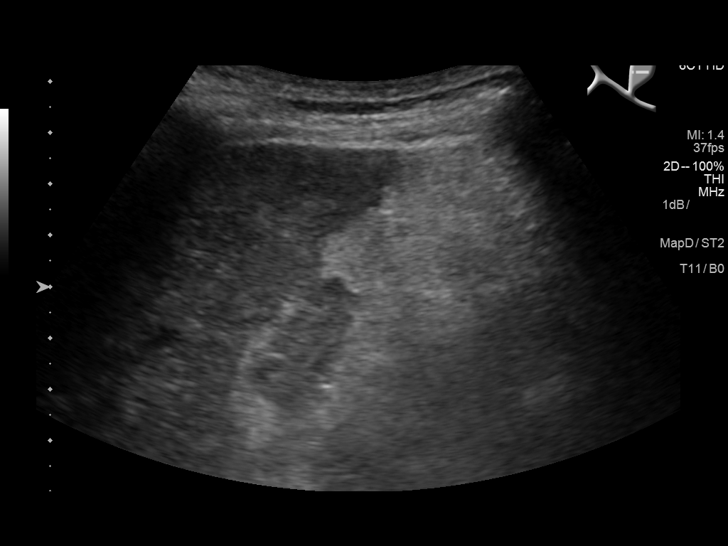
[im 26/48]
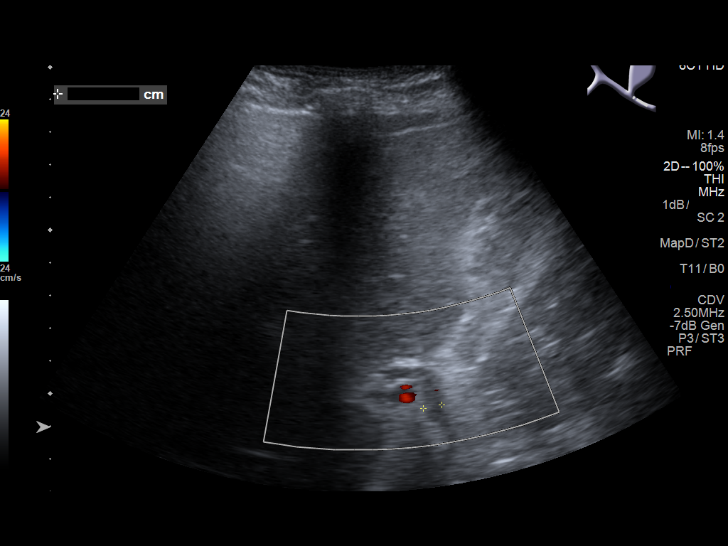
[im 30/48]
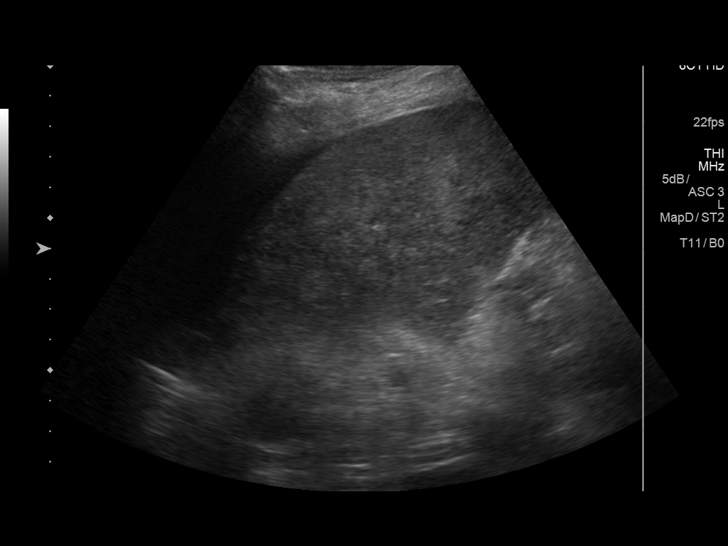
[im 32/48]
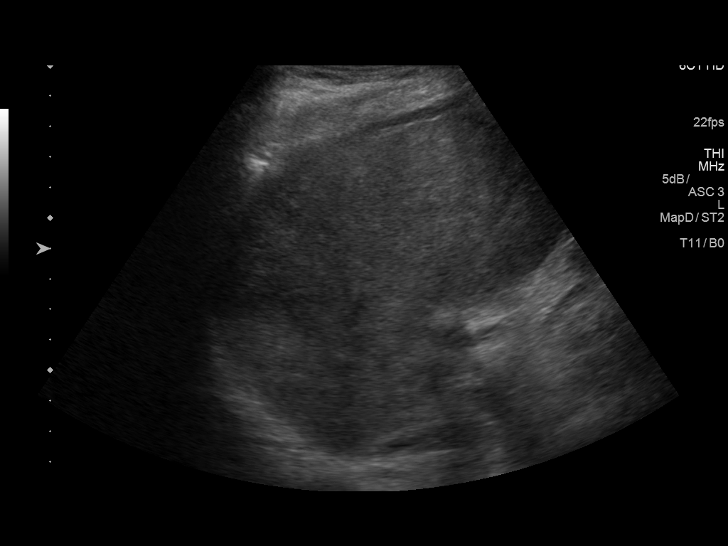
[im 36/48]
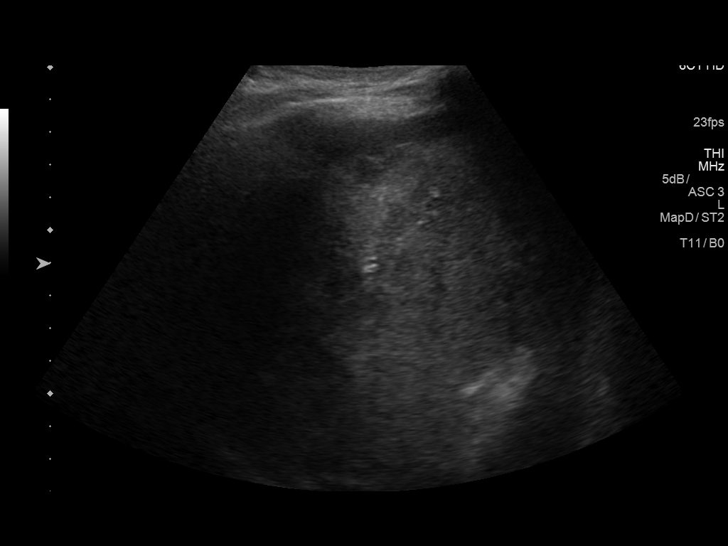
[im 40/48]
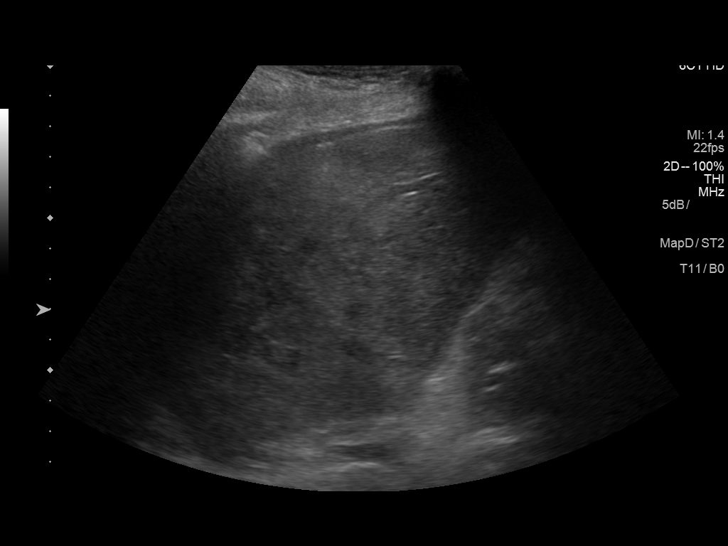
[im 44/48]
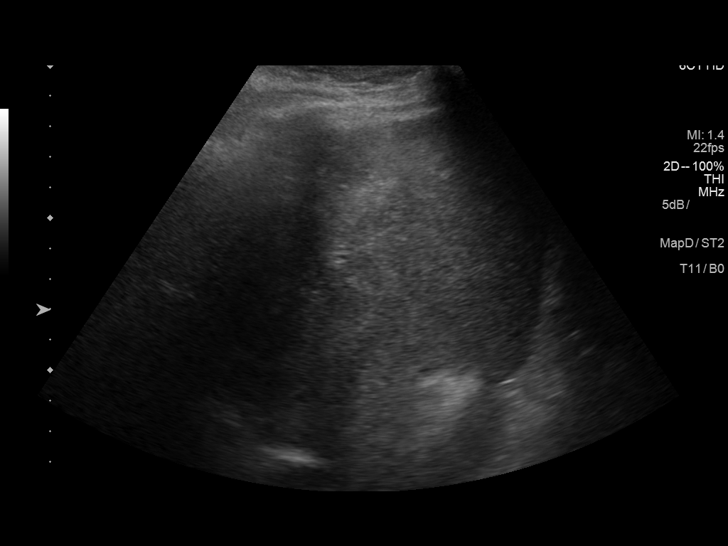
[im 48/48]
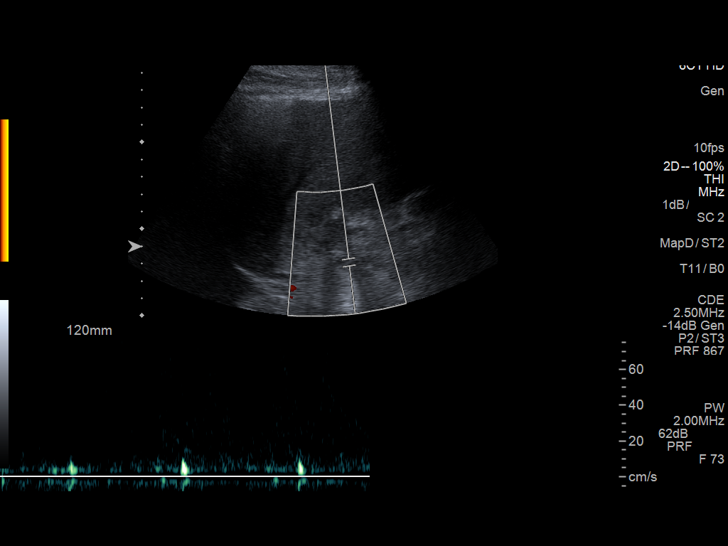

[14 of 25 positions shown; findings below may reference images not displayed]

FINDINGS: Gallbladder:

Mild gallbladder wall thickening with probable sludge within the
gallbladder. There is no evidence of sonographic Murphy sign.

Common bile duct:

Diameter: 5.8 mm. There is no evidence of CBD or intrahepatic
biliary dilatation.

Liver:

Heterogeneity of the liver with hepatic mass is are identified as
seen on recent CT. Thrombus within the portal vein is noted. A tiny
amount of ascites adjacent to the liver is again noted.
IMPRESSION: No evidence of biliary dilatation.

Hepatic metastases again noted with tiny amount of perihepatic
ascites and thrombus within the portal vein.

Mildly thick-walled gallbladder with gallbladder sludge which is
likely related to hepatic dysfunction or possibly metastatic
disease.

## 2018-03-15 IMAGING — CT CT ABD-PELV W/ CM
2 of 5 series · 16 of 46 positions shown, 18 images · IV contrast (iopamidol)
Comparison: None.

CLINICAL DATA: 20 pound weight loss in past year. Elevated liver
function tests. Diarrhea.

EXAM:
CT ABDOMEN AND PELVIS WITH CONTRAST
TECHNIQUE: Multidetector CT imaging of the abdomen and pelvis was performed
using the standard protocol following bolus administration of
intravenous contrast.
CONTRAST:  75mL [id number] IOPAMIDOL ([id number]) INJECTION 61%

[Series 2: axial soft tissue · axial · 0.76mm/px · z∈[-855,-465]mm · 13 of 88 slices shown, 15 images]
[im 5/88  soft-tissue]
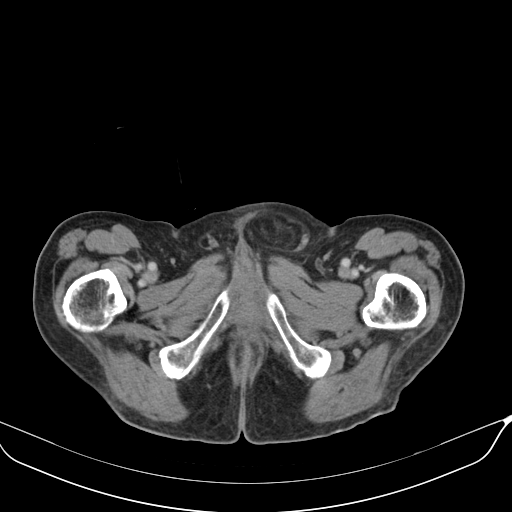
[im 5/88  bone]
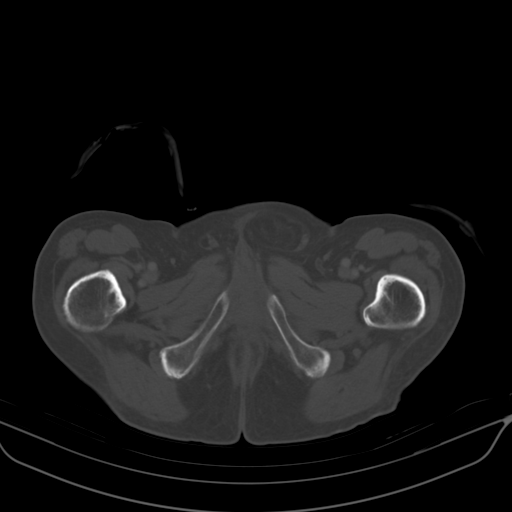
[im 10/88  soft-tissue]
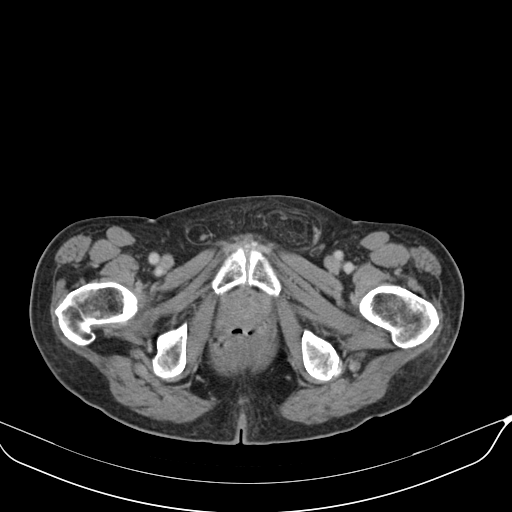
[im 20/88  soft-tissue]
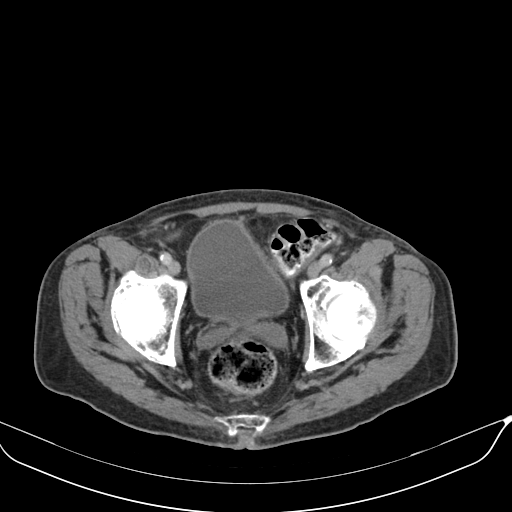
[im 25/88  soft-tissue]
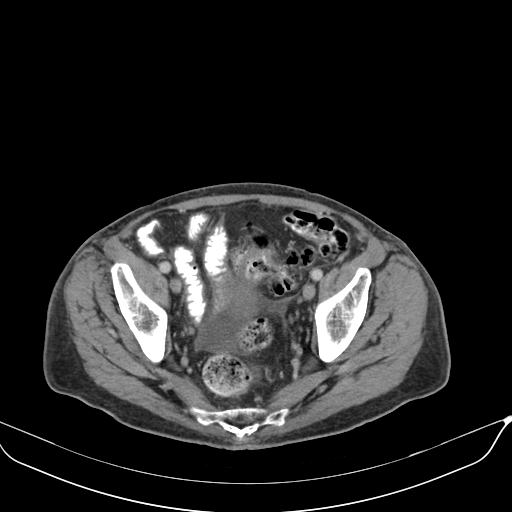
[im 30/88  soft-tissue]
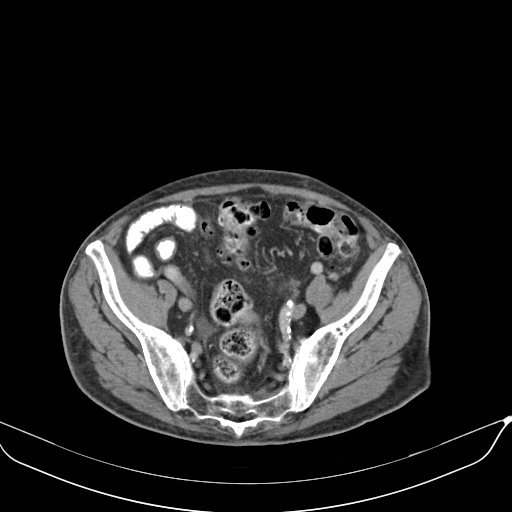
[im 39/88  soft-tissue]
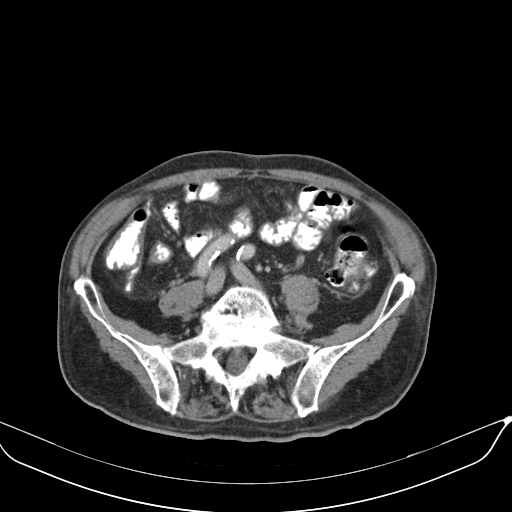
[im 44/88  soft-tissue]
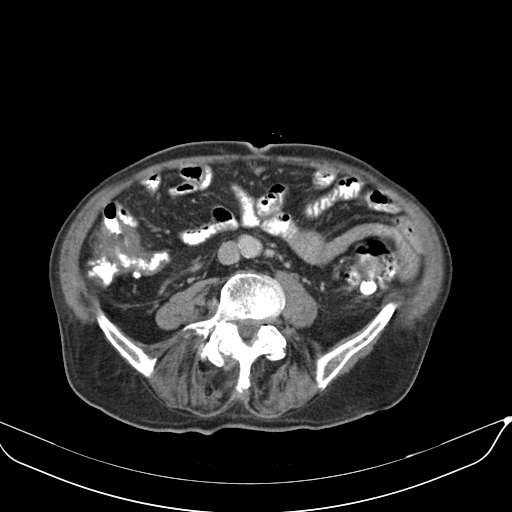
[im 49/88  soft-tissue]
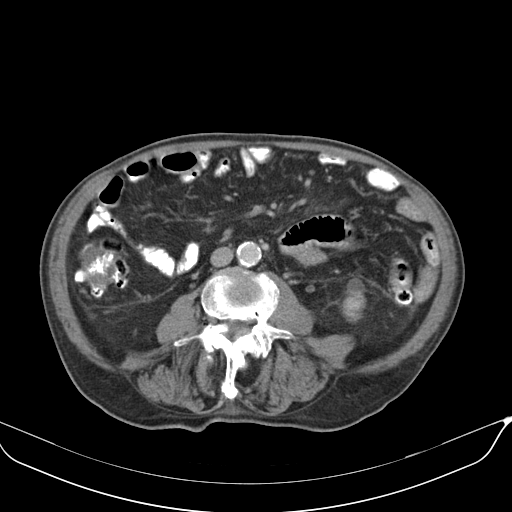
[im 59/88  soft-tissue]
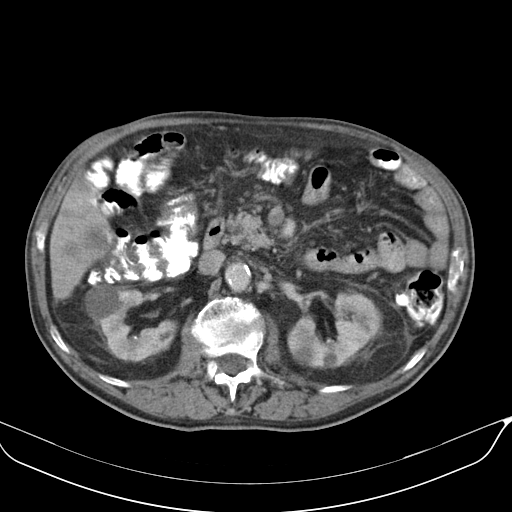
[im 59/88  bone]
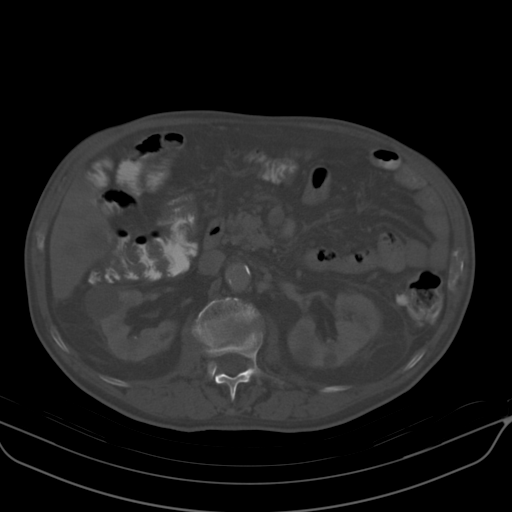
[im 63/88  soft-tissue]
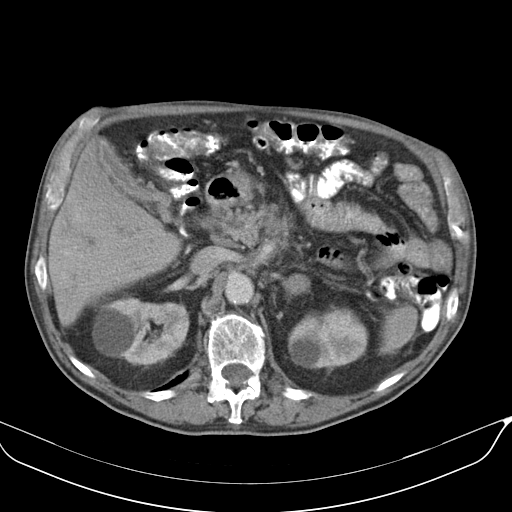
[im 68/88  soft-tissue]
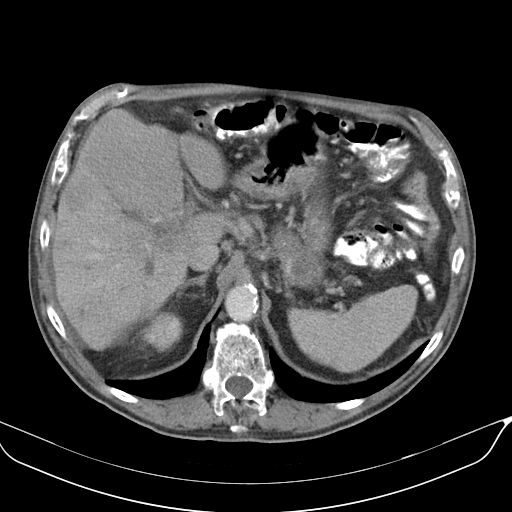
[im 78/88  soft-tissue]
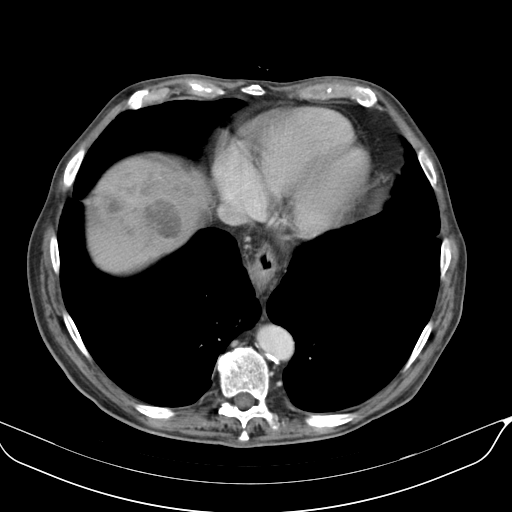
[im 83/88  soft-tissue]
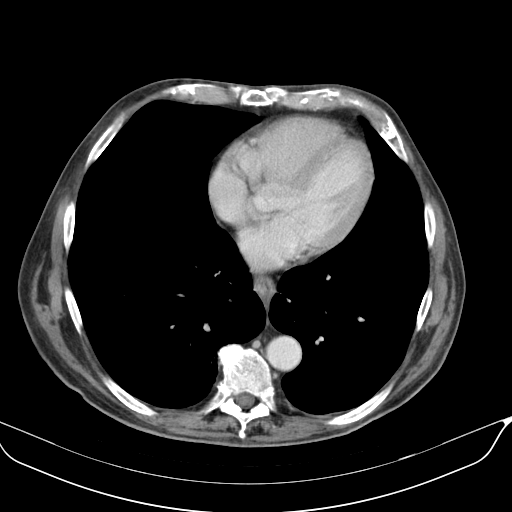

[Series 602: coronal · coronal · 0.85mm/px · 3 of 126 slices shown]
[im 42/126  soft-tissue]
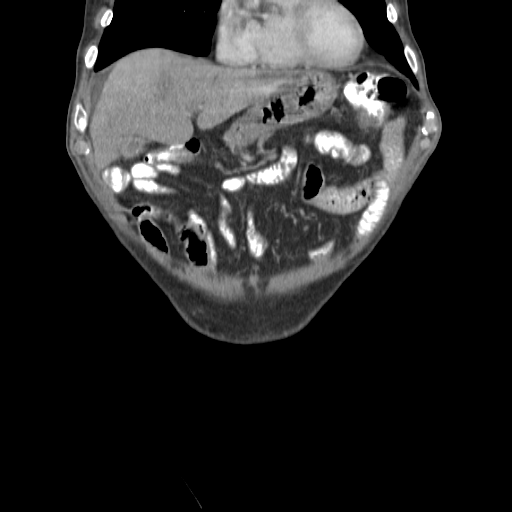
[im 56/126  soft-tissue]
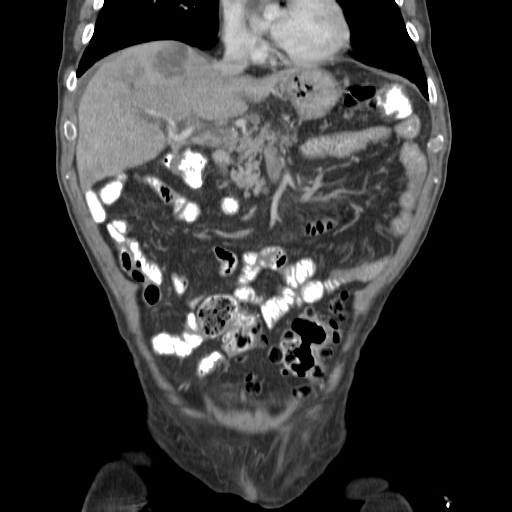
[im 70/126  soft-tissue]
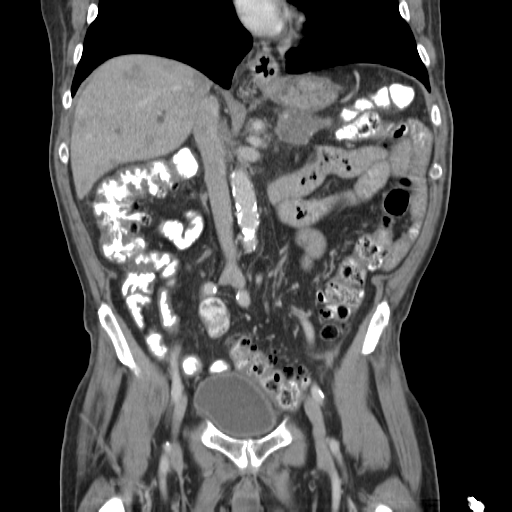

[16 of 46 positions shown; findings below may reference images not displayed]

FINDINGS: Lower Chest: No acute findings.

Hepatobiliary: Diffuse low-attenuation liver masses, consistent with
diffuse liver metastases. Index lesion in dome of right hepatic lobe
measures 3.3 x 2.8 cm on image [DATE]. Gallbladder is unremarkable. No
evidence of biliary ductal dilatation.

Pancreas: Solid-appearing mass in the pancreatic body and tail
measures 6.5 x 3.0 cm. This is consistent with primary pancreatic
carcinoma. This mass involves the splenic artery and vein, and
superior mesenteric vein. There is thrombosis of the superior
mesenteric and portal veins.

Spleen: Within normal limits in size and appearance.

Adrenals/Urinary Tract: 2 cm left adrenal mass is indeterminate.
Normal appearance of right adrenal gland. Multiple bilateral renal
cysts are seen, however there is no evidence of renal mass or
hydronephrosis. Tiny nonobstructive intrarenal calculi are noted
bilaterally. No evidence of hydronephrosis.

Stomach/Bowel: Tiny hiatal hernia. No evidence of obstruction,
inflammatory process or abnormal fluid collections. Diffuse colonic
diverticulosis, without evidence of diverticulitis.

Vascular/Lymphatic: No pathologically enlarged lymph nodes
identified, however tiny sub-cm peripancreatic lymph nodes are seen,
and early lymph node metastases cannot be excluded. No abdominal
aortic aneurysm. Aortic atherosclerosis.

Reproductive:  No mass identified.

Other: Minimal ascites in right upper quadrant and pelvis. Small
left inguinal hernia containing only fat.

Musculoskeletal:  No suspicious bone lesions identified.
IMPRESSION: 6.5 cm mass in the pancreatic body and tail, consistent with primary
pancreatic carcinoma. This mass involves the splenic artery and
vein, as well as the superior mesenteric vein.

Diffuse liver metastases. Superior mesenteric and portal vein
thrombosis noted.

Tiny sub-cm peripancreatic lymph nodes are not pathologically
enlarged, however lymph node metastases cannot be excluded.

Indeterminate 2 cm left adrenal mass.
